# Patient Record
Sex: Female | Born: 1995 | Hispanic: No | Smoking: Former smoker
Health system: Southern US, Community
[De-identification: ages and names within clinical notes are randomized; demographics above are authoritative.]

## PROBLEM LIST (undated history)

## (undated) ENCOUNTER — Inpatient Hospital Stay (HOSPITAL_COMMUNITY): Payer: Self-pay

## (undated) DIAGNOSIS — F32A Depression, unspecified: Secondary | ICD-10-CM

## (undated) DIAGNOSIS — T7840XA Allergy, unspecified, initial encounter: Secondary | ICD-10-CM

## (undated) DIAGNOSIS — F419 Anxiety disorder, unspecified: Secondary | ICD-10-CM

## (undated) DIAGNOSIS — D649 Anemia, unspecified: Secondary | ICD-10-CM

## (undated) DIAGNOSIS — F329 Major depressive disorder, single episode, unspecified: Secondary | ICD-10-CM

## (undated) HISTORY — DX: Major depressive disorder, single episode, unspecified: F32.9

## (undated) HISTORY — DX: Depression, unspecified: F32.A

## (undated) HISTORY — DX: Anemia, unspecified: D64.9

## (undated) HISTORY — DX: Allergy, unspecified, initial encounter: T78.40XA

## (undated) HISTORY — PX: WISDOM TOOTH EXTRACTION: SHX21

## (undated) HISTORY — DX: Anxiety disorder, unspecified: F41.9

---
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
## 2013-01-08 ENCOUNTER — Other Ambulatory Visit: Payer: Self-pay | Admitting: Physician Assistant

## 2013-03-04 ENCOUNTER — Other Ambulatory Visit: Payer: Self-pay | Admitting: Physician Assistant

## 2013-03-04 NOTE — Telephone Encounter (Signed)
Patient's mother wants to send a message to Benny Lennert regarding her daughter's prescription for clonazePAM.  She states that her daughter is in [place] and she will see a therapist on the 27th.  Her daughter has run out of her prescription and is very anxious.  She wants to know if she can have a refill until she sees this therapist.  The desired pharmacy would be [facility] and the number there is [phone]. The mother's cell number is [phone] if you need to call her.

## 2013-03-05 NOTE — Telephone Encounter (Signed)
Signed at TL desk.  

## 2014-03-01 IMAGING — NM NM HEPATO W/GB/PHARM/[PERSON_NAME]
2 series · 12 of 12 positions shown · non-contrast
Comparison: Ultrasound 02/20/2012

CLINICAL DATA: Nausea with vomiting.

NUCLEAR MEDICINE HEPATOBILIARY IMAGING WITH GALLBLADDER EF
TECHNIQUE: Sequential images of the abdomen were obtained [DATE] minutes following intravenous administration of
radiopharmaceutical.  After slow intravenous infusion of
micrograms Cholecystokinin, gallbladder ejection fraction was
determined.
Radiopharmaceutical:  5.0 mCi Wc-TTm Choletec

[Series 0: hepatobiliary · 3.20mm/px · 6 of 31 frames shown (1 of 2)]
[frame 3/31]
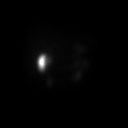
[frame 8/31]
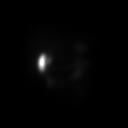
[frame 13/31]
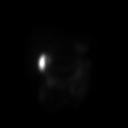
[frame 18/31]
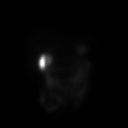
[frame 23/31]
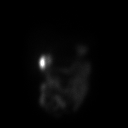
[frame 29/31]
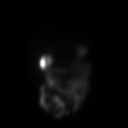

[Series 0: hepatobiliary · 3.20mm/px · 6 of 46 frames shown (2 of 2)]
[frame 4/46]
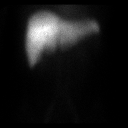
[frame 12/46]
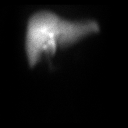
[frame 20/46]
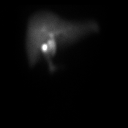
[frame 27/46]
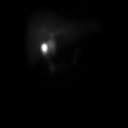
[frame 35/46]
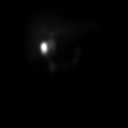
[frame 43/46]
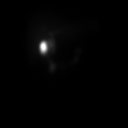

[12 of 12 positions shown; findings below may reference images not displayed]

FINDINGS: Radiopharmaceutical was taken up by the liver and
excreted into the biliary system.  Activity is identified in the
gallbladder and small bowel.  The gallbladder ejection fraction is
62.5%.  Normal ejection fraction is greater than 30%.

The patient did not experience symptoms during CCK infusion.
IMPRESSION: Normal hepatobiliary examination and normal gallbladder ejection
fraction.

## 2014-03-01 IMAGING — US US ABDOMEN COMPLETE
1 series · 14 of 25 positions shown · non-contrast
Comparison: None.

CLINICAL DATA: Nausea and vomiting

ABDOMINAL ULTRASOUND COMPLETE

[Series 1: us abdomen complete · 0.30mm/px · 14 of 60 slices shown]
[im 1/60]
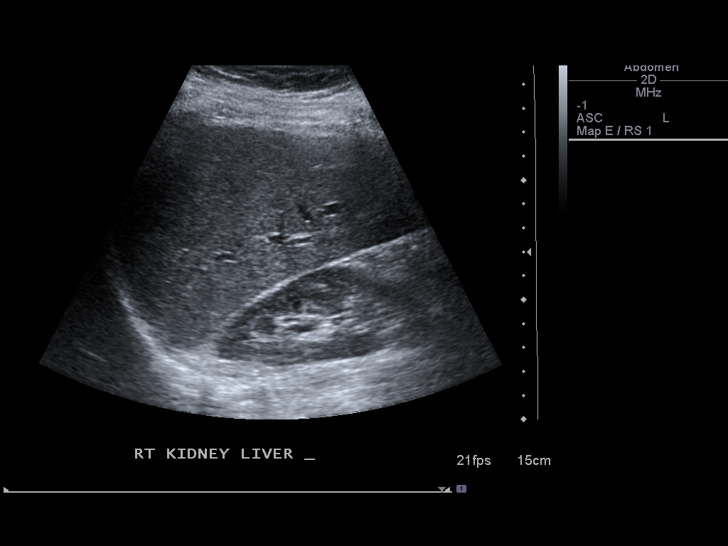
[im 5/60]
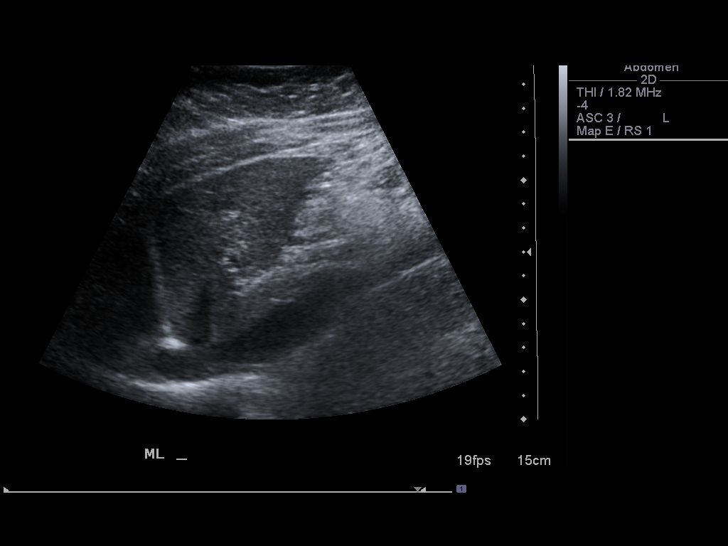
[im 10/60]
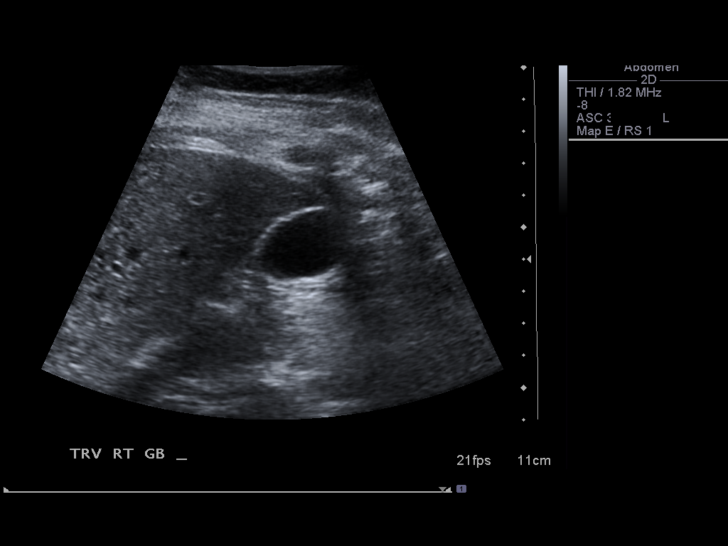
[im 15/60]
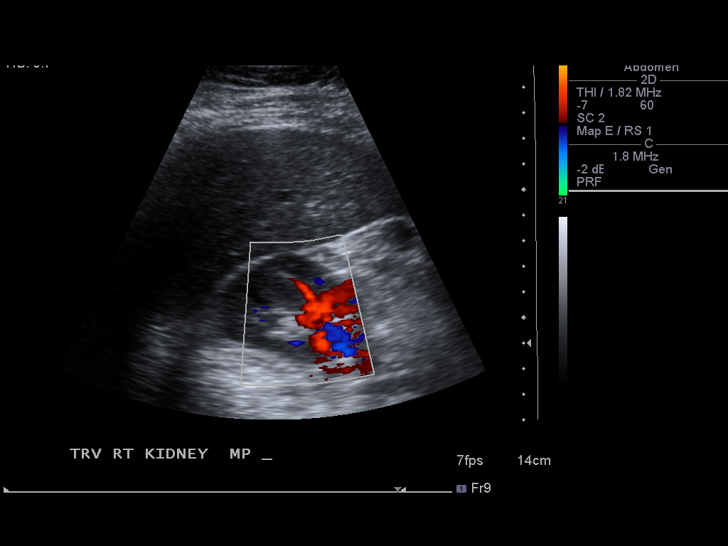
[im 20/60]
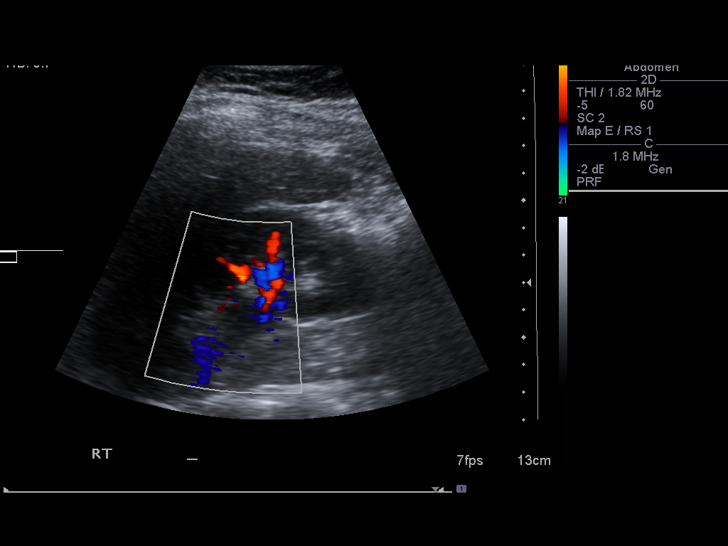
[im 23/60]
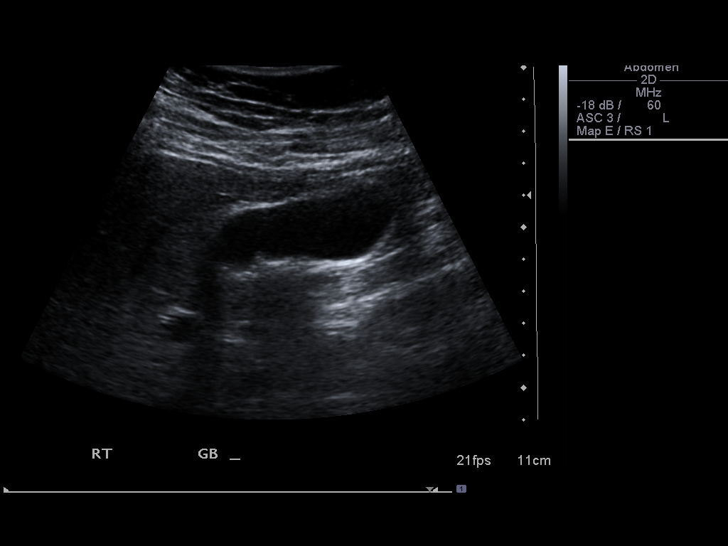
[im 28/60]
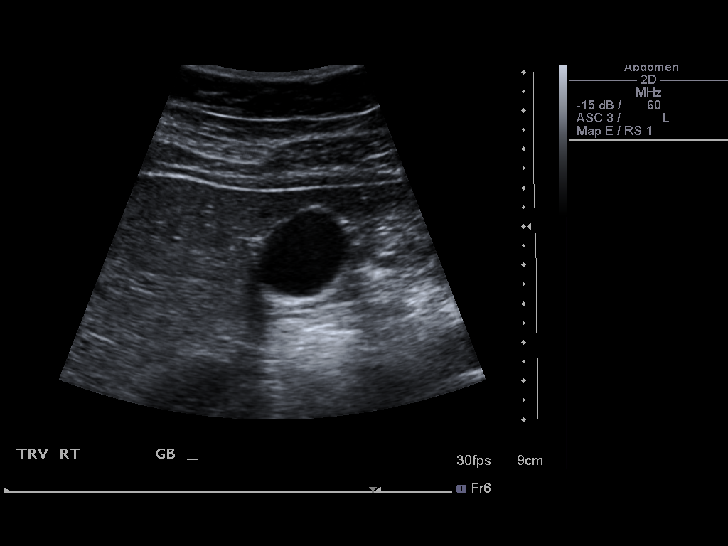
[im 32/60]
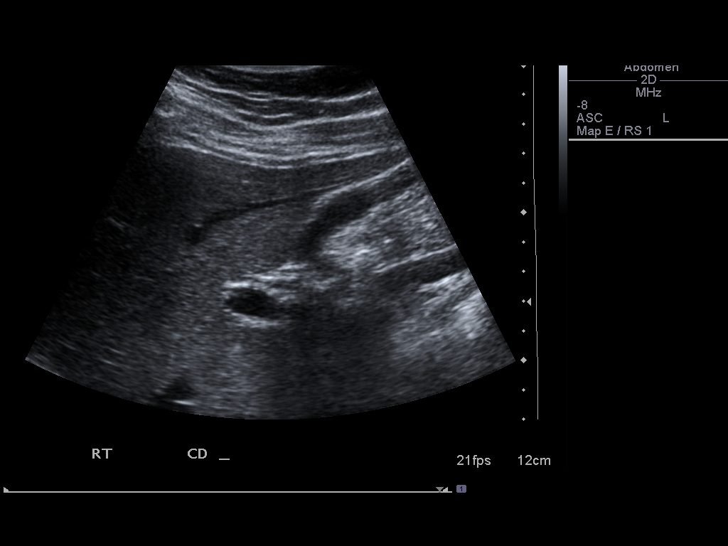
[im 37/60]
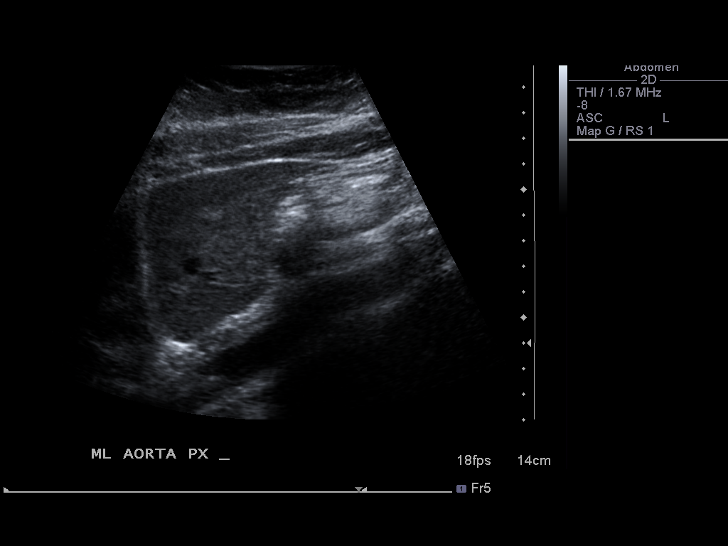
[im 40/60]
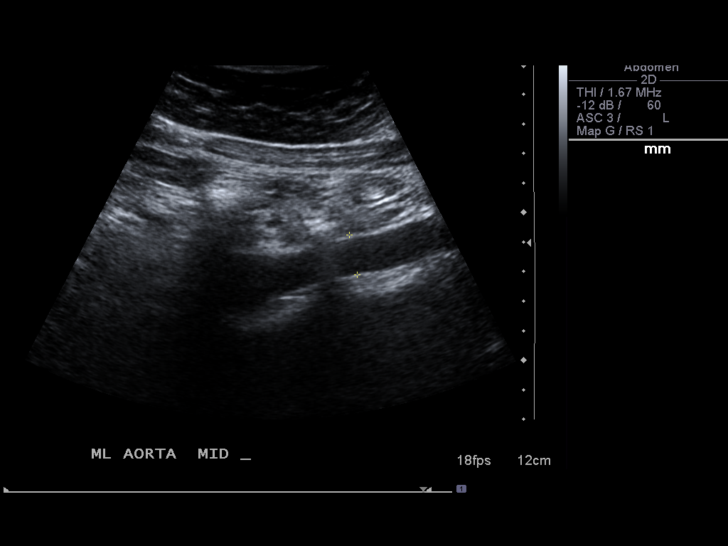
[im 45/60]
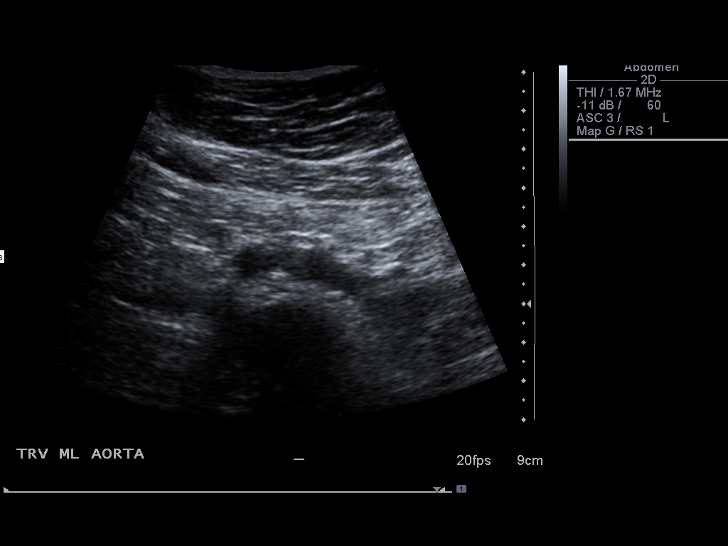
[im 50/60]
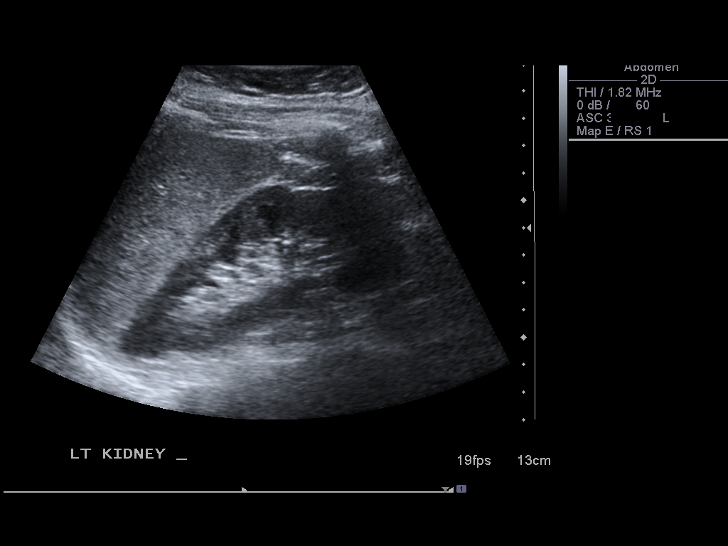
[im 55/60]
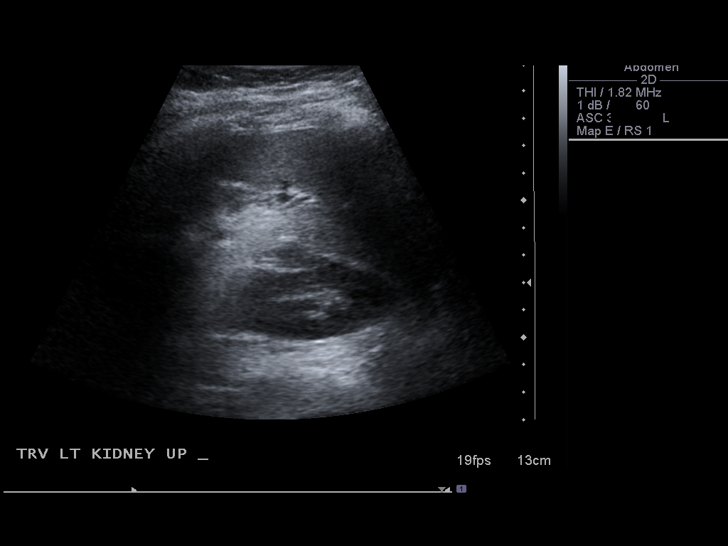
[im 60/60]
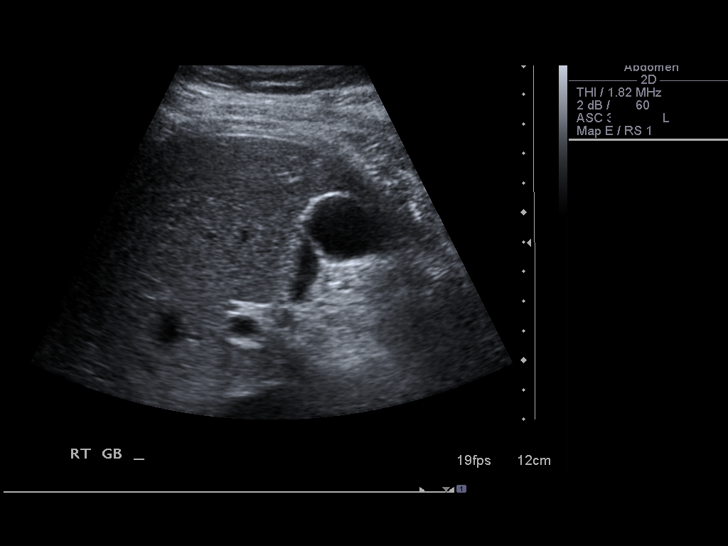

[14 of 25 positions shown; findings below may reference images not displayed]

FINDINGS: Gallbladder:  No gallstones, gallbladder wall thickening, or
pericholecystic fluid.

Common Bile Duct:  Within normal limits in caliber.

Liver: No focal mass lesion identified.  Within normal limits in
parenchymal echogenicity.

IVC:  Appears normal.

Pancreas:  No abnormality identified.

Spleen:  Within normal limits in size and echotexture.

Right kidney:  Normal in size and parenchymal echogenicity.  No
evidence of mass or hydronephrosis.

Left kidney:  Normal in size and parenchymal echogenicity.  No
evidence of mass or hydronephrosis.

Abdominal Aorta:  No aneurysm identified.
IMPRESSION: Negative abdominal ultrasound.

## 2017-11-01 ENCOUNTER — Other Ambulatory Visit: Payer: Self-pay

## 2017-11-01 ENCOUNTER — Ambulatory Visit: Payer: Self-pay | Admitting: Physician Assistant

## 2017-11-01 ENCOUNTER — Encounter: Payer: Self-pay | Admitting: Physician Assistant

## 2017-11-01 VITALS — BP 126/80 | HR 111 | Temp 98.8°F | Resp 16 | Ht 64.5 in | Wt 194.2 lb

## 2017-11-01 DIAGNOSIS — N898 Other specified noninflammatory disorders of vagina: Secondary | ICD-10-CM

## 2017-11-01 DIAGNOSIS — R5383 Other fatigue: Secondary | ICD-10-CM

## 2017-11-01 DIAGNOSIS — Z124 Encounter for screening for malignant neoplasm of cervix: Secondary | ICD-10-CM

## 2017-11-01 LAB — POCT WET + KOH PREP
TRICH BY WET PREP: ABSENT
YEAST BY WET PREP: ABSENT
Yeast by KOH: ABSENT

## 2017-11-01 NOTE — Progress Notes (Signed)
PRIMARY CARE AT [place], [place] [address] [phone]  Date:  11/01/2017   Name:  Amy Melton   DOB:  10/19/1996   MRN:  [id number]  PCP:  Mancel Bale, PA-C    History of Present Illness:  Amy Melton is a 21 y.o. female patient who presents to PCP with  Chief Complaint  Patient presents with  . Gynecologic Exam     --patient reports years of abnormal vaginal discharge and odor.  She feels that this is significant more than normal.  The discharge odor can not be described, but does report an odor that is more prominent by the end of the day.  She works a strenuous labor job, and notes that by the end of the day her underwear are sometimes very wet.  She has never utilized a panty-liner.  She is not sexually active ever.  She does use tampons.   --she has also noted fatigue.  Feels tired though may have full hours of sleep.  She denies any chest pains, sob, or palpitations.  This can occur at any time.  She does have a history of anemia. Periods can be heavy and cramping. No dysuria or frequency  Patient Active Problem List   Diagnosis Date Noted  . Bipolar 2 disorder ([place]) 11/02/2012  . Unspecified deficiency anemia 03/20/2012  . GERD (gastroesophageal reflux disease) 03/20/2012  . Obesity 03/20/2012    Past Medical History:  Diagnosis Date  . Allergy   . Anemia   . Anxiety   . Depression     No past surgical history on file.  Social History   Tobacco Use  . Smoking status: Never Smoker  . Smokeless tobacco: Never Used  Substance Use Topics  . Alcohol use: No  . Drug use: No    No family history on file.  No Known Allergies  Medication list has been reviewed and updated.  Current Outpatient Medications on File Prior to Visit  Medication Sig Dispense Refill  . ABILIFY 10 MG tablet TAKE 1/2 TO 1 TABLET EVERY DAY (Patient not taking: Reported on 11/01/2017) 30 tablet 0  . clonazePAM (KLONOPIN) 0.5 MG tablet TAKE 1 TABLET BY MOUTH TWICE  DAILY AS NEEDED FOR ANXIETY (Patient not taking: Reported on 11/01/2017) 60 tablet 0   No current facility-administered medications on file prior to visit.     ROS ROS otherwise unremarkable unless listed above.  Physical Examination: BP 126/80   Pulse (!) 111   Temp 98.8 F (37.1 C) (Oral)   Resp 16   Ht 5' 4.5" (1.638 m)   Wt 194 lb 3.2 oz (88.1 kg)   LMP 10/07/2017   SpO2 98%   BMI 32.82 kg/m  Ideal Body Weight: Weight in (lb) to have BMI = 25: 147.6  Physical Exam  Constitutional: She is oriented to person, place, and time. She appears well-developed and well-nourished. No distress.  HENT:  Head: Normocephalic and atraumatic.  Right Ear: External ear normal.  Left Ear: External ear normal.  Eyes: Conjunctivae and EOM are normal. Pupils are equal, round, and reactive to light.  Neck: No thyroid mass and no thyromegaly present.  Cardiovascular: Normal rate, regular rhythm and intact distal pulses. Exam reveals no friction rub.  No murmur heard. Pulmonary/Chest: Effort normal. No respiratory distress.  Neurological: She is alert and oriented to person, place, and time.  Skin: She is not diaphoretic.  Psychiatric: She has a normal mood and affect. Her behavior is normal.  Assessment and Plan: [name] is a 21 y.o. female who is here today for cc of  Chief Complaint  Patient presents with  . Gynecologic Exam     Other fatigue - Plan: CBC, CMP14+EGFR, TSH  Vaginal discharge - Plan: POCT Wet + KOH Prep, Pap IG (Image Guided)  Screening for cervical cancer - Plan: Pap IG (Image Guided)  Ivar Drape, PA-C [facility] 11/27/20189:03 AM

## 2017-11-01 NOTE — Patient Instructions (Addendum)
The wet prep appears normal.   I would like you to await contact for pap smear.     IF you received an x-ray today, you will receive an invoice from [facility]. Please contact [facility] at [phone] with questions or concerns regarding your invoice.   IF you received labwork today, you will receive an invoice from [facility]. Please contact [facility] at [phone] with questions or concerns regarding your invoice.   Our billing staff will not be able to assist you with questions regarding bills from these companies.  You will be contacted with the lab results as soon as they are available. The fastest way to get your results is to activate your My Chart account. Instructions are located on the last page of this paperwork. If you have not heard from us regarding the results in 2 weeks, please contact this office.

## 2017-11-02 LAB — CMP14+EGFR
A/G RATIO: 1.9 (ref 1.2–2.2)
ALT: 12 IU/L (ref 0–32)
AST: 16 IU/L (ref 0–40)
Albumin: 4.6 g/dL (ref 3.5–5.5)
Alkaline Phosphatase: 77 IU/L (ref 39–117)
BUN/Creatinine Ratio: 10 (ref 9–23)
BUN: 7 mg/dL (ref 6–20)
CALCIUM: 9.6 mg/dL (ref 8.7–10.2)
CO2: 23 mmol/L (ref 20–29)
Chloride: 104 mmol/L (ref 96–106)
Creatinine, Ser: 0.7 mg/dL (ref 0.57–1.00)
GFR calc non Af Amer: 123 mL/min/{1.73_m2} (ref >59)
GFR, EST AFRICAN AMERICAN: 141 mL/min/{1.73_m2} (ref >59)
GLOBULIN, TOTAL: 2.4 g/dL (ref 1.5–4.5)
Glucose: 120 mg/dL — ABNORMAL HIGH (ref 65–99)
POTASSIUM: 3.9 mmol/L (ref 3.5–5.2)
SODIUM: 141 mmol/L (ref 134–144)
TOTAL PROTEIN: 7 g/dL (ref 6.0–8.5)

## 2017-11-02 LAB — CBC
HEMATOCRIT: 38.7 % (ref 34.0–46.6)
Hemoglobin: 13.3 g/dL (ref 11.1–15.9)
MCH: 31.6 pg (ref 26.6–33.0)
MCHC: 34.4 g/dL (ref 31.5–35.7)
MCV: 92 fL (ref 79–97)
Platelets: 299 10*3/uL (ref 150–379)
RBC: 4.21 x10E6/uL (ref 3.77–5.28)
RDW: 14.3 % (ref 12.3–15.4)
WBC: 15.3 10*3/uL — AB (ref 3.4–10.8)

## 2017-11-02 LAB — TSH: TSH: 1.72 u[IU]/mL (ref 0.450–4.500)

## 2017-11-07 LAB — PAP IG (IMAGE GUIDED): PAP SMEAR COMMENT: 0

## 2017-12-18 ENCOUNTER — Telehealth: Payer: Self-pay | Admitting: Physician Assistant

## 2017-12-18 NOTE — Telephone Encounter (Signed)
Copied from CRM [id number]. Topic: Quick Communication - See Telephone Encounter >> Dec 18, 2017  3:58 PM Amy Melton, Amy Melton wrote: CRM for notification. See Telephone encounter for:  Lab results from Nov.  12/18/17.

## 2017-12-20 NOTE — Telephone Encounter (Signed)
Copied from CRM [phone]. Topic: General - Other >> Dec 20, 2017  4:20 PM Viviann SpareWhite, Amy wrote: Reason for CRM: Patient called again about her labs done in Nov 2018. The reason she is calling is because she has come questions about her labs. Patient would really like a call back asap. She can be reached @ [phone].

## 2017-12-20 NOTE — Telephone Encounter (Signed)
Sent letter in mail today.

## 2017-12-21 NOTE — Telephone Encounter (Signed)
Pt has questions about her labs.  Requests a call back  [place] to [place]

## 2017-12-22 NOTE — Telephone Encounter (Signed)
Copied from CRM [phone]. Topic: Inquiry >> Dec 22, 2017  3:26 PM Raquel SarnaHayes, Teresa G wrote: Pt has called 2 times with no call back.  She has been waiting for a return call to discuss her lab results.  She got her results, but she now has a question to ask and discuss.

## 2017-12-25 NOTE — Telephone Encounter (Signed)
Please contact patient and inquire as to what her question is?

## 2017-12-30 NOTE — Telephone Encounter (Signed)
Please make sure that you continue to try to reach the patient for the question at least twice, on different days

## 2018-03-21 ENCOUNTER — Encounter: Payer: Self-pay | Admitting: Physician Assistant

## 2018-08-22 ENCOUNTER — Encounter: Payer: Self-pay | Admitting: Physician Assistant

## 2018-08-22 ENCOUNTER — Other Ambulatory Visit: Payer: Self-pay

## 2018-08-22 ENCOUNTER — Ambulatory Visit: Payer: Managed Care, Other (non HMO) | Admitting: Physician Assistant

## 2018-08-22 VITALS — BP 117/80 | HR 93 | Temp 98.7°F | Resp 18 | Ht 64.5 in | Wt 196.0 lb

## 2018-08-22 DIAGNOSIS — F4010 Social phobia, unspecified: Secondary | ICD-10-CM

## 2018-08-22 DIAGNOSIS — Z23 Encounter for immunization: Secondary | ICD-10-CM | POA: Diagnosis not present

## 2018-08-22 DIAGNOSIS — G479 Sleep disorder, unspecified: Secondary | ICD-10-CM | POA: Diagnosis not present

## 2018-08-22 DIAGNOSIS — F331 Major depressive disorder, recurrent, moderate: Secondary | ICD-10-CM | POA: Diagnosis not present

## 2018-08-22 MED ORDER — DESVENLAFAXINE SUCCINATE ER 25 MG PO TB24: 25.0000 mg | ORAL_TABLET | Freq: Every day | ORAL | 0 refills | Status: DC

## 2018-08-22 MED ORDER — CLONAZEPAM 0.5 MG PO TABS: 0.2500 mg | ORAL_TABLET | Freq: Every day | ORAL | 0 refills | Status: DC

## 2018-08-22 MED ORDER — TRAZODONE HCL 50 MG PO TABS: 25.0000 mg | ORAL_TABLET | Freq: Every evening | ORAL | 0 refills | Status: DC | PRN

## 2018-08-22 NOTE — Patient Instructions (Addendum)
[facility] - [address], [place], [place] [address] Phone: [phone]   To start the Trazodone - start with 1/2 pill at night for the 1st 4 nights - increase the dose by 25mg (1/2 pill) every 4-5 nights until either you sleep well, have side effects or reach a nightly dose of 150mg   If you have lab work done today you will be contacted with your lab results within the next 2 weeks.  If you have not heard from us then please contact us. The fastest way to get your results is to register for My Chart.   IF you received an x-ray today, you will receive an invoice from [facility]. Please contact [facility] at [phone] with questions or concerns regarding your invoice.   IF you received labwork today, you will receive an invoice from [facility]. Please contact [facility] at [phone] with questions or concerns regarding your invoice.   Our billing staff will not be able to assist you with questions regarding bills from these companies.  You will be contacted with the lab results as soon as they are available. The fastest way to get your results is to activate your My Chart account. Instructions are located on the last page of this paperwork. If you have not heard from us regarding the results in 2 weeks, please contact this office.     

## 2018-08-22 NOTE — Progress Notes (Signed)
Amy Melton  MRN: [id number] DOB: 08-23-1996  PCP: Julieanne Manson, MD  Chief Complaint  Patient presents with  . Anxiety  . Depression    Subjective:  Pt presents to clinic for pression and anxiety and wanting to go back on medications.  Back from [place] - planning on going back to school -- anxiety is really bad - difficult to go to work - [employer] -   She has not been on therapy and she stopped all her meds - she felt like she was doing really good but lately (2-3 months) and things have started to return  Social anxiety is bad, depression, cannot turn off mind - she over thinks everything, she cannot sleep but always tired and has no energy.  She has been on abilify, lamictal, wellbutrin --  Graduated HS - but did not take SAT  Calling out of work - to tired - people irritate her   History is obtained by patient.  Review of Systems  Psychiatric/Behavioral: Positive for dysphoric mood and sleep disturbance (Does not sleep well). The patient is nervous/anxious.     Patient Active Problem List   Diagnosis Date Noted  . Bipolar 2 disorder (HCC) 11/02/2012  . Unspecified deficiency anemia 03/20/2012  . GERD (gastroesophageal reflux disease) 03/20/2012  . Obesity 03/20/2012    No current outpatient medications on file prior to visit.   No current facility-administered medications on file prior to visit.     No Known Allergies  Past Medical History:  Diagnosis Date  . Allergy   . Anemia   . Anxiety   . Depression    Social History   Social History Narrative  . Not on file   Social History   Tobacco Use  . Smoking status: Former Games developer  . Smokeless tobacco: Never Used  Substance Use Topics  . Alcohol use: Yes    Alcohol/week: 2.0 standard drinks    Types: 2 Standard drinks or equivalent per week  . Drug use: No   family history is not on file.     Objective:  BP 117/80   Pulse 93   Temp 98.7 F (37.1 C) (Oral)   Resp 18   Ht 5' 4.5"  (1.638 m)   Wt 196 lb (88.9 kg)   LMP 08/14/2018   SpO2 98%   BMI 33.12 kg/m  Body mass index is 33.12 kg/m.  Wt Readings from Last 3 Encounters:  08/22/18 196 lb (88.9 kg)  11/01/17 194 lb 3.2 oz (88.1 kg)  11/02/12 220 lb (99.8 kg) (99 %, Z= 2.20)*   * Growth percentiles are based on CDC (Girls, 2-20 Years) data.    Physical Exam  Constitutional: She is oriented to person, place, and time.  HENT:  Head: Normocephalic and atraumatic.  Right Ear: Hearing and external ear normal.  Left Ear: Hearing and external ear normal.  Eyes: Conjunctivae are normal.  Neck: Normal range of motion.  Cardiovascular: Normal rate, regular rhythm and normal heart sounds.  No murmur heard. Pulmonary/Chest: Effort normal and breath sounds normal. She has no wheezes.  Neurological: She is alert and oriented to person, place, and time.  Skin: Skin is warm and dry.  Psychiatric: Judgment normal.  Vitals reviewed.   Assessment and Plan :  Social anxiety disorder - Plan: Desvenlafaxine Succinate ER (PRISTIQ) 25 MG TB24, clonazePAM (KLONOPIN) 0.5 MG tablet, DISCONTINUED: clonazePAM (KLONOPIN) 0.5 MG tablet  Moderate episode of recurrent major depressive disorder (HCC) - Plan: Desvenlafaxine Succinate ER (PRISTIQ)  25 MG TB24  Sleep disturbance - Plan: traZODone (DESYREL) 50 MG tablet  Flu vaccine need - Plan: Flu Vaccine QUAD 36+ mos IM   Restart Klonopin to help control anxiety in the short-term.  Start titration of Pristiq with 25 mg for 2 weeks then increase to 50 if well tolerated.  Start trazodone titration for sleep, instructions given to patient.  Follow-up with me in 4 to 6 weeks.  Patient verbalized to me that they understand the following: diagnosis, what is being done for them, what to expect and what should be done at home.  Their questions have been answered.  See after visit summary for patient specific instructions.  Benny Lennert PA-C  [facility] 08/28/2018 10:16 PM  Please note: Portions of this report may have been transcribed using dragon voice recognition software. Every effort was made to ensure accuracy; however, inadvertent computerized transcription errors may be present.

## 2018-08-23 ENCOUNTER — Telehealth: Payer: Self-pay | Admitting: Physician Assistant

## 2018-08-23 MED ORDER — CLONAZEPAM 0.5 MG PO TABS: 0.2500 mg | ORAL_TABLET | Freq: Every day | ORAL | 0 refills | Status: DC

## 2018-08-23 NOTE — Telephone Encounter (Signed)
Copied from CRM [id number]. Topic: General - Other >> Aug 23, 2018 11:58 AM Percival SpanishKennedy, Cheryl W wrote:  Pt said pharmacy is waiting ton the following RX clonazePAM (KLONOPIN) 0.5 MG tablet   [facility] [address]

## 2018-08-23 NOTE — Telephone Encounter (Signed)
Pt called to check status of Rx; it appears the pt did not receive her printed copy of the medication; contact to advise

## 2018-08-23 NOTE — Telephone Encounter (Signed)
Please see note below. 

## 2018-08-24 ENCOUNTER — Encounter: Payer: Self-pay | Admitting: *Deleted

## 2018-08-24 NOTE — Telephone Encounter (Signed)
Patient already picked up prescription.

## 2018-09-11 ENCOUNTER — Ambulatory Visit: Payer: Self-pay | Admitting: Internal Medicine

## 2018-09-13 ENCOUNTER — Other Ambulatory Visit: Payer: Self-pay | Admitting: Physician Assistant

## 2018-09-13 DIAGNOSIS — F4010 Social phobia, unspecified: Secondary | ICD-10-CM

## 2018-09-13 DIAGNOSIS — F331 Major depressive disorder, recurrent, moderate: Secondary | ICD-10-CM

## 2018-09-14 NOTE — Telephone Encounter (Signed)
Please advise 

## 2018-09-15 NOTE — Telephone Encounter (Signed)
Previous PCP Amy Melton Last visit in Aug Seems plans were to follow up with her at new employment meds refilled for 30 days no refills

## 2019-10-13 ENCOUNTER — Other Ambulatory Visit: Payer: Self-pay

## 2019-10-13 ENCOUNTER — Ambulatory Visit (HOSPITAL_COMMUNITY)
Admission: EM | Admit: 2019-10-13 | Discharge: 2019-10-13 | Disposition: A | Discharge status: Home / Self Care | Payer: Managed Care, Other (non HMO) | Attending: Physician Assistant | Admitting: Physician Assistant

## 2019-10-13 ENCOUNTER — Encounter (HOSPITAL_COMMUNITY): Payer: Self-pay

## 2019-10-13 DIAGNOSIS — S161XXA Strain of muscle, fascia and tendon at neck level, initial encounter: Secondary | ICD-10-CM

## 2019-10-13 DIAGNOSIS — J0101 Acute recurrent maxillary sinusitis: Secondary | ICD-10-CM

## 2019-10-13 MED ORDER — AMOXICILLIN-POT CLAVULANATE 875-125 MG PO TABS: 1.0000 | ORAL_TABLET | Freq: Two times a day (BID) | ORAL | 0 refills | Status: DC

## 2019-10-13 NOTE — ED Provider Notes (Signed)
[facility]    CSN: [id number] Arrival date & time: 10/13/19  1408      History   Chief Complaint Chief Complaint  Patient presents with  . Neck Pain  . Migraine  . Facial Pain    HPI Amy Melton is a 23 y.o. female.   Patient here concerned with "sinus infection" x 2 weeks.  Notes when she gets sinus infections neck and heard start hurting.  Admits nasal congestion, rhinorrhea, maxillary sinus pain, denies fever, chills, otalgia, nausea, vomiting, diarrhea, abdominal pain, wheezing, coughing, SOB.  Taking ibuprofen, last dose 7 hours ago with mild relief of sx.  She saw ENT 6 months ago who just treated her for sinus infection.       Past Medical History:  Diagnosis Date  . Allergy   . Anemia   . Anxiety   . Depression     Patient Active Problem List   Diagnosis Date Noted  . Bipolar 2 disorder (HCC) 11/02/2012  . Unspecified deficiency anemia 03/20/2012  . GERD (gastroesophageal reflux disease) 03/20/2012  . Obesity 03/20/2012    History reviewed. No pertinent surgical history.  OB History   No obstetric history on file.      Home Medications    Prior to Admission medications   Medication Sig Start Date End Date Taking? Authorizing Provider  amoxicillin-clavulanate (AUGMENTIN) 875-125 MG tablet Take 1 tablet by mouth every 12 (twelve) hours. 10/13/19   Evern CoreLindquist, Selby Slovacek, PA-C  clonazePAM (KLONOPIN) 0.5 MG tablet Take 0.5-1 tablets (0.25-0.5 mg total) by mouth daily. for anxiety 08/23/18   Valarie ConesWeber, Dema SeverinSarah L, PA-C  Desvenlafaxine Succinate ER 25 MG TB24 TAKE 1 TABLET EVERY DAY 09/15/18   Myles LippsSantiago, Irma M, MD  traZODone (DESYREL) 50 MG tablet Take 0.5-3 tablets (25-150 mg total) by mouth at bedtime as needed for sleep. 08/22/18   Valarie ConesWeber, Dema SeverinSarah L, PA-C    Family History History reviewed. No pertinent family history.  Social History Social History   Tobacco Use  . Smoking status: Former Games developermoker  . Smokeless tobacco: Never Used  Substance Use Topics   . Alcohol use: Yes    Alcohol/week: 2.0 standard drinks    Types: 2 Standard drinks or equivalent per week  . Drug use: No     Allergies   Patient has no known allergies.   Review of Systems Review of Systems  Constitutional: Negative for chills and fatigue.  HENT: Positive for congestion, postnasal drip, rhinorrhea, sinus pressure and sinus pain. Negative for ear discharge, ear pain, facial swelling, sneezing and sore throat.   Eyes: Negative for discharge and redness.  Respiratory: Negative for cough, chest tightness and shortness of breath.   Gastrointestinal: Negative for abdominal pain, diarrhea, nausea and vomiting.  Musculoskeletal: Positive for neck pain and neck stiffness. Negative for back pain and myalgias.  Skin: Negative for color change and pallor.  Neurological: Positive for headaches. Negative for weakness, light-headedness and numbness.  Hematological: Negative for adenopathy. Does not bruise/bleed easily.  Psychiatric/Behavioral: Positive for sleep disturbance. Negative for confusion.     Physical Exam Triage Vital Signs ED Triage Vitals  Enc Vitals Group     BP 10/13/19 1425 125/89     Pulse Rate 10/13/19 1425 94     Resp 10/13/19 1425 16     Temp 10/13/19 1425 98.8 F (37.1 C)     Temp Source 10/13/19 1425 Oral     SpO2 10/13/19 1425 99 %     Weight --  Height --      Head Circumference --      Peak Flow --      Pain Score 10/13/19 1423 10     Pain Loc --      Pain Edu? --      Excl. in GC? --    No data found.  Updated Vital Signs BP 125/89 (BP Location: Left Arm)   Pulse 94   Temp 98.8 F (37.1 C) (Oral)   Resp 16   LMP 10/11/2019   SpO2 99%   Visual Acuity Right Eye Distance:   Left Eye Distance:   Bilateral Distance:    Right Eye Near:   Left Eye Near:    Bilateral Near:     Physical Exam Vitals signs and nursing note reviewed.  Constitutional:      General: She is not in acute distress.    Appearance: Normal  appearance. She is well-developed. She is obese. She is not ill-appearing or toxic-appearing.  HENT:     Head: Normocephalic and atraumatic.     Right Ear: Tympanic membrane and ear canal normal. No swelling or tenderness. No middle ear effusion. Tympanic membrane is not injected, scarred, perforated, retracted or bulging.     Left Ear: Tympanic membrane and ear canal normal. No swelling or tenderness.  No middle ear effusion. Tympanic membrane is not injected, scarred, perforated, retracted or bulging.     Nose: Mucosal edema, congestion and rhinorrhea present. Rhinorrhea is clear.     Right Turbinates: Swollen. Not enlarged.     Left Turbinates: Swollen. Not enlarged.     Right Sinus: Maxillary sinus tenderness and frontal sinus tenderness present.     Left Sinus: Maxillary sinus tenderness and frontal sinus tenderness present.     Mouth/Throat:     Mouth: Mucous membranes are moist.     Pharynx: Oropharynx is clear. No pharyngeal swelling, oropharyngeal exudate, posterior oropharyngeal erythema or uvula swelling.     Tonsils: No tonsillar exudate or tonsillar abscesses. 0 on the right. 0 on the left.  Eyes:     Conjunctiva/sclera: Conjunctivae normal.  Neck:     Musculoskeletal: Neck supple. Normal range of motion. Pain with movement and muscular tenderness present. No edema, erythema, neck rigidity or spinous process tenderness.     Meningeal: Brudzinski's sign and Kernig's sign absent.  Cardiovascular:     Rate and Rhythm: Normal rate and regular rhythm.     Heart sounds: No murmur.  Pulmonary:     Effort: Pulmonary effort is normal. No respiratory distress.     Breath sounds: Normal breath sounds. No wheezing, rhonchi or rales.  Musculoskeletal: Normal range of motion.  Lymphadenopathy:     Cervical: No cervical adenopathy.  Skin:    General: Skin is warm and dry.     Capillary Refill: Capillary refill takes less than 2 seconds.  Neurological:     General: No focal deficit  present.     Mental Status: She is alert and oriented to person, place, and time.  Psychiatric:        Mood and Affect: Mood normal.        Behavior: Behavior normal.      UC Treatments / Results  Labs (all labs ordered are listed, but only abnormal results are displayed) Labs Reviewed - No data to display  EKG   Radiology No results found.  Procedures Procedures (including critical care time)  Medications Ordered in UC Medications - No data to display  Initial  Impression / Assessment and Plan / UC Course  I have reviewed the triage vital signs and the nursing notes.  Pertinent labs & imaging results that were available during my care of the patient were reviewed by me and considered in my medical decision making (see chart for details).      Final Clinical Impressions(s) / UC Diagnoses   Final diagnoses:  Acute recurrent maxillary sinusitis  Acute strain of neck muscle, initial encounter     Discharge Instructions     Follow up with ENT and PCP Take medication as prescribed. Take ibuprofen or tylenol every 6 to 8 hours as needed for pain. Ice neck 15 minutes 4 times per day.    ED Prescriptions    Medication Sig Dispense Auth. Provider   amoxicillin-clavulanate (AUGMENTIN) 875-125 MG tablet Take 1 tablet by mouth every 12 (twelve) hours. 14 tablet Peri Jefferson, PA-C     PDMP not reviewed this encounter.   Peri Jefferson, PA-C 10/13/19 1639

## 2019-10-13 NOTE — ED Triage Notes (Signed)
Pt present neck, headache and sinus pain. Symptoms been going on for over  Two weeks. Pt states that her neck has stiff and she is unable to move it  As much.

## 2019-10-13 NOTE — Discharge Instructions (Addendum)
Follow up with ENT and PCP Take medication as prescribed. Take ibuprofen or tylenol every 6 to 8 hours as needed for pain. Ice neck 15 minutes 4 times per day.

## 2020-05-21 ENCOUNTER — Encounter (HOSPITAL_COMMUNITY): Payer: Self-pay

## 2020-05-21 ENCOUNTER — Ambulatory Visit (HOSPITAL_COMMUNITY)
Admission: EM | Admit: 2020-05-21 | Discharge: 2020-05-21 | Disposition: A | Discharge status: Home / Self Care | Payer: BC Managed Care – PPO | Attending: Family Medicine | Admitting: Family Medicine

## 2020-05-21 DIAGNOSIS — R1030 Lower abdominal pain, unspecified: Secondary | ICD-10-CM | POA: Insufficient documentation

## 2020-05-21 DIAGNOSIS — Z3202 Encounter for pregnancy test, result negative: Secondary | ICD-10-CM

## 2020-05-21 DIAGNOSIS — N898 Other specified noninflammatory disorders of vagina: Secondary | ICD-10-CM | POA: Diagnosis present

## 2020-05-21 LAB — POCT URINALYSIS DIP (DEVICE)
Bilirubin Urine: NEGATIVE
Glucose, UA: NEGATIVE mg/dL
Hgb urine dipstick: NEGATIVE
Ketones, ur: NEGATIVE mg/dL
Nitrite: NEGATIVE
Protein, ur: NEGATIVE mg/dL
Specific Gravity, Urine: 1.025 (ref 1.005–1.030)
Urobilinogen, UA: 0.2 mg/dL (ref 0.0–1.0)
pH: 7.5 (ref 5.0–8.0)

## 2020-05-21 LAB — POC URINE PREG, ED: Preg Test, Ur: NEGATIVE

## 2020-05-21 MED ORDER — LIDOCAINE HCL (PF) 1 % IJ SOLN
INTRAMUSCULAR | Status: AC
  Filled 2020-05-21: qty 2

## 2020-05-21 MED ORDER — CEFTRIAXONE SODIUM 500 MG IJ SOLR
INTRAMUSCULAR | Status: AC
  Filled 2020-05-21: qty 500

## 2020-05-21 MED ORDER — AZITHROMYCIN 250 MG PO TABS
1000.0000 mg | ORAL_TABLET | Freq: Once | ORAL | Status: AC
  Administered 2020-05-21: 1000 mg via ORAL

## 2020-05-21 MED ORDER — METRONIDAZOLE 500 MG PO TABS
500.0000 mg | ORAL_TABLET | Freq: Two times a day (BID) | ORAL | 0 refills | Status: DC
Start: 2020-05-21 — End: 2023-09-29

## 2020-05-21 MED ORDER — FLUCONAZOLE 150 MG PO TABS
150.0000 mg | ORAL_TABLET | Freq: Every day | ORAL | 0 refills | Status: DC
Start: 2020-05-21 — End: 2023-09-29

## 2020-05-21 MED ORDER — AZITHROMYCIN 250 MG PO TABS
ORAL_TABLET | ORAL | Status: AC
  Filled 2020-05-21: qty 4

## 2020-05-21 MED ORDER — CEFTRIAXONE SODIUM 500 MG IJ SOLR
500.0000 mg | Freq: Once | INTRAMUSCULAR | Status: AC
  Administered 2020-05-21: 500 mg via INTRAMUSCULAR

## 2020-05-21 NOTE — Discharge Instructions (Addendum)
Treating you for gonorrhea, chlamydia, trichomonas, yeast and bacterial vaginosis today. Medicine given here for gonorrhea and chlamydia.  Sending a prescription to the pharmacy to treat for bacterial vaginosis, yeast and trichomonas Urine did not show infection and you are not pregnant.

## 2020-05-21 NOTE — ED Triage Notes (Signed)
Pt c/o vaginal itching and pain. Pt c/o bumps around lips of vagina and [id number]. PT has been taking monistat since Sunday, but it's not helping. Pt requests a female provider.

## 2020-05-22 LAB — CERVICOVAGINAL ANCILLARY ONLY
Bacterial Vaginitis (gardnerella): NEGATIVE
Candida Glabrata: NEGATIVE
Candida Vaginitis: NEGATIVE
Chlamydia: NEGATIVE
Comment: NEGATIVE
Comment: NEGATIVE
Comment: NEGATIVE
Comment: NEGATIVE
Comment: NEGATIVE
Comment: NORMAL
Neisseria Gonorrhea: NEGATIVE
Trichomonas: NEGATIVE

## 2020-05-22 LAB — URINE CULTURE: Culture: NO GROWTH

## 2020-05-23 NOTE — ED Provider Notes (Signed)
[facility]    CSN: [id number] Arrival date & time: 05/21/20  1142      History   Chief Complaint Chief Complaint  Patient presents with  . vaginal irritation    HPI Amy Melton is a 24 y.o. female.   Patient is a 24 year old female presents today with vaginal itching, irritation pain. Reporting some bumps in the vagina area this started after the vaginal irritation. Has been using Monistat since Sunday that made the situation worse. Denies any specific vaginal discharge. She has mild lower pelvic discomfort and low-grade fever. Overall reports she just does not feel well. Currently sexual active with one partner, unprotected. Uses for control. Has had some mild spotting.   ROS per HPI      Past Medical History:  Diagnosis Date  . Allergy   . Anemia   . Anxiety   . Depression     Patient Active Problem List   Diagnosis Date Noted  . Bipolar 2 disorder (HCC) 11/02/2012  . Unspecified deficiency anemia 03/20/2012  . GERD (gastroesophageal reflux disease) 03/20/2012  . Obesity 03/20/2012    History reviewed. No pertinent surgical history.  OB History   No obstetric history on file.      Home Medications    Prior to Admission medications   Medication Sig Start Date End Date Taking? Authorizing Provider  buPROPion (WELLBUTRIN XL) 150 MG 24 hr tablet Take 10 mg by mouth. 07/12/19  Yes [provider]  clonazePAM (KLONOPIN) 0.5 MG tablet Take 0.5-1 tablets (0.25-0.5 mg total) by mouth daily. for anxiety 08/23/18   Valarie Cones, Dema Severin, PA-C  fluconazole (DIFLUCAN) 150 MG tablet Take 1 tablet (150 mg total) by mouth daily. 05/21/20   Dahlia Byes A, NP  metroNIDAZOLE (FLAGYL) 500 MG tablet Take 1 tablet (500 mg total) by mouth 2 (two) times daily. 05/21/20   Dahlia Byes A, NP  traZODone (DESYREL) 50 MG tablet Take 0.5-3 tablets (25-150 mg total) by mouth at bedtime as needed for sleep. 08/22/18   Valarie Cones, Dema Severin, PA-C  Desvenlafaxine Succinate ER 25 MG TB24  TAKE 1 TABLET EVERY DAY Patient taking differently: 150 mg.  09/15/18 05/21/20  Myles Lipps, MD    Family History Family History  Problem Relation Age of Onset  . Cancer Mother     Social History Social History   Tobacco Use  . Smoking status: Former Games developer  . Smokeless tobacco: Never Used  Substance Use Topics  . Alcohol use: Yes    Alcohol/week: 7.0 standard drinks    Types: 1 Glasses of wine, 5 Cans of beer, 1 Shots of liquor per week  . Drug use: No     Allergies   Patient has no known allergies.   Review of Systems Review of Systems   Physical Exam Triage Vital Signs ED Triage Vitals  Enc Vitals Group     BP 05/21/20 1232 130/84     Pulse Rate 05/21/20 1232 92     Resp 05/21/20 1232 16     Temp 05/21/20 1232 99.5 F (37.5 C)     Temp Source 05/21/20 1232 Oral     SpO2 05/21/20 1232 97 %     Weight 05/21/20 1233 160 lb (72.6 kg)     Height 05/21/20 1233 5\' 5"  (1.651 m)     Head Circumference --      Peak Flow --      Pain Score 05/21/20 1232 8     Pain Loc --  Pain Edu? --      Excl. in GC? --    No data found.  Updated Vital Signs BP 130/84   Pulse 92   Temp 99.5 F (37.5 C) (Oral)   Resp 16   Ht 5\' 5"  (1.651 m)   Wt 160 lb (72.6 kg)   SpO2 97%   BMI 26.63 kg/m   Visual Acuity Right Eye Distance:   Left Eye Distance:   Bilateral Distance:    Right Eye Near:   Left Eye Near:    Bilateral Near:     Physical Exam Vitals and nursing note reviewed.  Constitutional:      General: She is not in acute distress.    Appearance: Normal appearance. She is not ill-appearing, toxic-appearing or diaphoretic.  HENT:     Head: Normocephalic.     Nose: Nose normal.  Eyes:     Conjunctiva/sclera: Conjunctivae normal.  Pulmonary:     Effort: Pulmonary effort is normal.  Genitourinary:    Comments: Swelling irration and erythema to the external vaginal area. multiple inflamed hair follicles and open sores. No vesicles. Thick white  discharge at vaginal opening.   Internal vaginal exam with thick, mucous green/clear discharge fo cervix.  Cervical friability No CMT  Musculoskeletal:        General: Normal range of motion.     Cervical back: Normal range of motion.  Skin:    General: Skin is warm and dry.     Findings: No rash.  Neurological:     Mental Status: She is alert.  Psychiatric:        Mood and Affect: Mood normal.      UC Treatments / Results  Labs (all labs ordered are listed, but only abnormal results are displayed) Labs Reviewed  POCT URINALYSIS DIP (DEVICE) - Abnormal; Notable for the following components:      Result Value   Leukocytes,Ua TRACE (*)    All other components within normal limits  URINE CULTURE  POC URINE PREG, ED  CERVICOVAGINAL ANCILLARY ONLY    EKG   Radiology No results found.  Procedures Procedures (including critical care time)  Medications Ordered in UC Medications  cefTRIAXone (ROCEPHIN) injection 500 mg (500 mg Intramuscular Given 05/21/20 1323)  azithromycin (ZITHROMAX) tablet 1,000 mg (1,000 mg Oral Given 05/21/20 1323)    Initial Impression / Assessment and Plan / UC Course  I have reviewed the triage vital signs and the nursing notes.  Pertinent labs & imaging results that were available during my care of the patient were reviewed by me and considered in my medical decision making (see chart for details).     Lower abdominal pain and vaginal discharge. Patient also with low-grade fever and overall not feeling well today. Based on pelvic exam there is some concern for STD or bacterial infection Rash to vaginal area appears to be more related to irritation possibly from shaving or the Monistat We will go ahead and cover for STDs today, BV and yeast Urine without infection or pregnancy Follow up as needed for continued or worsening symptoms  Final Clinical Impressions(s) / UC Diagnoses   Final diagnoses:  Vaginal discharge  Lower abdominal pain      Discharge Instructions     Treating you for gonorrhea, chlamydia, trichomonas, yeast and bacterial vaginosis today. Medicine given here for gonorrhea and chlamydia.  Sending a prescription to the pharmacy to treat for bacterial vaginosis, yeast and trichomonas Urine did not show infection and you are  not pregnant.     ED Prescriptions    Medication Sig Dispense Auth. Provider   metroNIDAZOLE (FLAGYL) 500 MG tablet Take 1 tablet (500 mg total) by mouth 2 (two) times daily. 14 tablet Latash Nouri A, NP   fluconazole (DIFLUCAN) 150 MG tablet Take 1 tablet (150 mg total) by mouth daily. 2 tablet Loura Halt A, NP     PDMP not reviewed this encounter.   Orvan July, NP 05/23/20 1347

## 2020-08-31 ENCOUNTER — Other Ambulatory Visit: Payer: Self-pay | Admitting: Critical Care Medicine

## 2020-08-31 ENCOUNTER — Other Ambulatory Visit: Payer: BC Managed Care – PPO

## 2020-08-31 DIAGNOSIS — Z20822 Contact with and (suspected) exposure to covid-19: Secondary | ICD-10-CM

## 2020-09-02 LAB — SPECIMEN STATUS REPORT

## 2020-09-02 LAB — SARS-COV-2, NAA 2 DAY TAT

## 2020-09-02 LAB — NOVEL CORONAVIRUS, NAA: SARS-CoV-2, NAA: NOT DETECTED

## 2023-03-21 LAB — OB RESULTS CONSOLE GC/CHLAMYDIA
Chlamydia: NEGATIVE
Neisseria Gonorrhea: NEGATIVE

## 2023-03-21 LAB — HEPATITIS C ANTIBODY: HCV Ab: NEGATIVE

## 2023-03-21 LAB — OB RESULTS CONSOLE HIV ANTIBODY (ROUTINE TESTING): HIV: NONREACTIVE

## 2023-03-21 LAB — OB RESULTS CONSOLE HEPATITIS B SURFACE ANTIGEN: Hepatitis B Surface Ag: NEGATIVE

## 2023-03-21 LAB — OB RESULTS CONSOLE RPR: RPR: NONREACTIVE

## 2023-03-21 LAB — OB RESULTS CONSOLE RUBELLA ANTIBODY, IGM: Rubella: IMMUNE

## 2023-05-22 ENCOUNTER — Emergency Department (HOSPITAL_COMMUNITY)
Admission: EM | Admit: 2023-05-22 | Discharge: 2023-05-23 | Disposition: A | Discharge status: Home / Self Care | Payer: BC Managed Care – PPO | Attending: Emergency Medicine | Admitting: Emergency Medicine

## 2023-05-22 ENCOUNTER — Encounter (HOSPITAL_COMMUNITY): Payer: Self-pay

## 2023-05-22 ENCOUNTER — Encounter: Payer: Self-pay | Admitting: Hematology and Oncology

## 2023-05-22 DIAGNOSIS — M545 Low back pain, unspecified: Secondary | ICD-10-CM | POA: Insufficient documentation

## 2023-05-22 DIAGNOSIS — Z3A18 18 weeks gestation of pregnancy: Secondary | ICD-10-CM | POA: Diagnosis not present

## 2023-05-22 DIAGNOSIS — O26892 Other specified pregnancy related conditions, second trimester: Secondary | ICD-10-CM | POA: Diagnosis not present

## 2023-05-22 LAB — COMPREHENSIVE METABOLIC PANEL
ALT: 20 U/L (ref 0–44)
AST: 19 U/L (ref 15–41)
Albumin: 3.3 g/dL — ABNORMAL LOW (ref 3.5–5.0)
Alkaline Phosphatase: 59 U/L (ref 38–126)
Anion gap: 9 (ref 5–15)
BUN: 8 mg/dL (ref 6–20)
CO2: 22 mmol/L (ref 22–32)
Calcium: 9.1 mg/dL (ref 8.9–10.3)
Chloride: 105 mmol/L (ref 98–111)
Creatinine, Ser: 0.64 mg/dL (ref 0.44–1.00)
GFR, Estimated: >60 mL/min (ref >60)
Glucose, Bld: 104 mg/dL — ABNORMAL HIGH (ref 70–99)
Potassium: 3.4 mmol/L — ABNORMAL LOW (ref 3.5–5.1)
Sodium: 136 mmol/L (ref 135–145)
Total Bilirubin: 0.3 mg/dL (ref 0.3–1.2)
Total Protein: 7.2 g/dL (ref 6.5–8.1)

## 2023-05-22 LAB — URINALYSIS, W/ REFLEX TO CULTURE (INFECTION SUSPECTED)
Bacteria, UA: NONE SEEN
Bilirubin Urine: NEGATIVE
Glucose, UA: NEGATIVE mg/dL
Hgb urine dipstick: NEGATIVE
Ketones, ur: NEGATIVE mg/dL
Nitrite: NEGATIVE
Protein, ur: NEGATIVE mg/dL
Specific Gravity, Urine: 1.014 (ref 1.005–1.030)
pH: 6 (ref 5.0–8.0)

## 2023-05-22 LAB — CBC
HCT: 39.1 % (ref 36.0–46.0)
Hemoglobin: 13 g/dL (ref 12.0–15.0)
MCH: 31 pg (ref 26.0–34.0)
MCHC: 33.2 g/dL (ref 30.0–36.0)
MCV: 93.3 fL (ref 80.0–100.0)
Platelets: 297 10*3/uL (ref 150–400)
RBC: 4.19 MIL/uL (ref 3.87–5.11)
RDW: 12.9 % (ref 11.5–15.5)
WBC: 13.8 10*3/uL — ABNORMAL HIGH (ref 4.0–10.5)
nRBC: 0 % (ref 0.0–0.2)

## 2023-05-22 LAB — HCG, QUANTITATIVE, PREGNANCY: hCG, Beta Chain, Quant, S: 13786 m[IU]/mL — ABNORMAL HIGH (ref <5)

## 2023-05-22 MED ORDER — LIDOCAINE 5 % EX PTCH
1.0000 | MEDICATED_PATCH | CUTANEOUS | Status: DC
  Administered 2023-05-23: 1 via TRANSDERMAL
  Filled 2023-05-22: qty 1

## 2023-05-22 MED ORDER — LACTATED RINGERS IV BOLUS
1000.0000 mL | Freq: Once | INTRAVENOUS | Status: AC
  Administered 2023-05-22: 1000 mL via INTRAVENOUS

## 2023-05-22 MED ORDER — MORPHINE SULFATE (PF) 2 MG/ML IV SOLN
2.0000 mg | Freq: Once | INTRAVENOUS | Status: AC
  Administered 2023-05-23: 2 mg via INTRAVENOUS
  Filled 2023-05-22: qty 1

## 2023-05-22 MED ORDER — ONDANSETRON HCL 4 MG/2ML IJ SOLN
4.0000 mg | Freq: Once | INTRAMUSCULAR | Status: AC
  Administered 2023-05-22: 4 mg via INTRAVENOUS
  Filled 2023-05-22: qty 2

## 2023-05-22 MED ORDER — POTASSIUM CHLORIDE CRYS ER 20 MEQ PO TBCR
40.0000 meq | EXTENDED_RELEASE_TABLET | Freq: Once | ORAL | Status: AC
  Administered 2023-05-22: 40 meq via ORAL
  Filled 2023-05-22: qty 2

## 2023-05-22 MED ORDER — MORPHINE SULFATE (PF) 2 MG/ML IV SOLN
2.0000 mg | Freq: Once | INTRAVENOUS | Status: AC
  Administered 2023-05-22: 2 mg via INTRAVENOUS
  Filled 2023-05-22: qty 1

## 2023-05-22 NOTE — ED Triage Notes (Signed)
Patient states she has lower back pain that radiates to her upper back that began on saturday. Patient states she is [redacted] weeks pregnant with an EDD of 10/21/2023. Denies bleeding or discharge, or concerns with urination. Patient was given cyclobenzaprine by her OBGYN, with no relief. Last dose was taken at 530pm.

## 2023-05-22 NOTE — ED Provider Notes (Signed)
Amy Melton EMERGENCY DEPARTMENT AT [facility] Provider Note   CSN: [id number] Arrival date & time: 05/22/23  1819     History {Add pertinent medical, surgical, social history, OB history to HPI:1} Chief Complaint  Patient presents with   Back Pain    Amy Melton is a 27 y.o. female.   Back Pain  Patient is a 27 year old female with no past surgical history, history of anemia anxiety depression allergies  She is present emergency room today with complaints of low back pain and some right-sided abdominal pain send that her symptoms began on Saturday and have been constant since.  She is [redacted] weeks pregnant has had regular OB monitoring.  She has had normal ultrasounds during her pregnancy so far.  She states that she has not had any urinary symptoms apart from frequency that was associated with her pregnancy.  She denies any burning with urination or fevers.  She states that she has had some issues w low back pain and occasional uses muscle relaxers.   She states her pain is worse than it has been previously.  She states it also feels quite different from previous episodes of back pain.      Home Medications Prior to Admission medications   Medication Sig Start Date End Date Taking? Authorizing Provider  buPROPion (WELLBUTRIN XL) 150 MG 24 hr tablet Take 10 mg by mouth. 07/12/19   [provider]  clonazePAM (KLONOPIN) 0.5 MG tablet Take 0.5-1 tablets (0.25-0.5 mg total) by mouth daily. for anxiety 08/23/18   Valarie Cones, Dema Severin, PA-C  fluconazole (DIFLUCAN) 150 MG tablet Take 1 tablet (150 mg total) by mouth daily. 05/21/20   Dahlia Byes A, NP  metroNIDAZOLE (FLAGYL) 500 MG tablet Take 1 tablet (500 mg total) by mouth 2 (two) times daily. 05/21/20   Dahlia Byes A, NP  traZODone (DESYREL) 50 MG tablet Take 0.5-3 tablets (25-150 mg total) by mouth at bedtime as needed for sleep. 08/22/18   Valarie Cones, Dema Severin, PA-C  Desvenlafaxine Succinate ER 25 MG TB24 TAKE 1 TABLET EVERY  DAY Patient taking differently: 150 mg.  09/15/18 05/21/20  Lezlie Lye, Meda Coffee, MD      Allergies    Patient has no known allergies.    Review of Systems   Review of Systems  Musculoskeletal:  Positive for back pain.    Physical Exam Updated Vital Signs BP 122/81   Pulse 79   Temp 98.9 F (37.2 C) (Oral)   Resp 14   Ht 5\' 5"  (1.651 m)   Wt 98.4 kg   SpO2 98%   BMI 36.11 kg/m  Physical Exam Vitals and nursing note reviewed.  Constitutional:      General: She is not in acute distress. HENT:     Head: Normocephalic and atraumatic.     Nose: Nose normal.  Eyes:     General: No scleral icterus. Cardiovascular:     Rate and Rhythm: Normal rate and regular rhythm.     Pulses: Normal pulses.     Heart sounds: Normal heart sounds.  Pulmonary:     Effort: Pulmonary effort is normal. No respiratory distress.     Breath sounds: No wheezing.  Abdominal:     Palpations: Abdomen is soft.     Tenderness: There is no abdominal tenderness.  Musculoskeletal:     Cervical back: Normal range of motion.     Right lower leg: No edema.     Left lower leg: No edema.  Skin:  General: Skin is warm and dry.     Capillary Refill: Capillary refill takes less than 2 seconds.  Neurological:     Mental Status: She is alert. Mental status is at baseline.  Psychiatric:        Mood and Affect: Mood normal.        Behavior: Behavior normal.     ED Results / Procedures / Treatments   Labs (all labs ordered are listed, but only abnormal results are displayed) Labs Reviewed  COMPREHENSIVE METABOLIC PANEL - Abnormal; Notable for the following components:      Result Value   Potassium 3.4 (*)    Glucose, Bld 104 (*)    Albumin 3.3 (*)    All other components within normal limits  CBC - Abnormal; Notable for the following components:   WBC 13.8 (*)    All other components within normal limits  URINALYSIS, W/ REFLEX TO CULTURE (INFECTION SUSPECTED) - Abnormal; Notable for the following  components:   APPearance HAZY (*)    Leukocytes,Ua LARGE (*)    All other components within normal limits  HCG, QUANTITATIVE, PREGNANCY - Abnormal; Notable for the following components:   hCG, Beta Chain, Quant, S 13,786 (*)    All other components within normal limits    EKG None  Radiology No results found.  Procedures Procedures  {Document cardiac monitor, telemetry assessment procedure when appropriate:1}  Medications Ordered in ED Medications  potassium chloride SA (KLOR-CON M) CR tablet 40 mEq (40 mEq Oral Given 05/22/23 2117)  lactated ringers bolus 1,000 mL (1,000 mLs Intravenous New Bag/Given 05/22/23 2117)  morphine (PF) 2 MG/ML injection 2 mg (2 mg Intravenous Given 05/22/23 2119)  ondansetron (ZOFRAN) injection 4 mg (4 mg Intravenous Given 05/22/23 2118)    ED Course/ Medical Decision Making/ A&P   {   Click here for ABCD2, HEART and other calculatorsREFRESH Note before signing :1}                          Medical Decision Making Amount and/or Complexity of Data Reviewed Labs: ordered.  Risk Prescription drug management.   ***  {Document critical care time when appropriate:1} {Document review of labs and clinical decision tools ie heart score, Chads2Vasc2 etc:1}  {Document your independent review of radiology images, and any outside records:1} {Document your discussion with family members, caretakers, and with consultants:1} {Document social determinants of health affecting pt's care:1} {Document your decision making why or why not admission, treatments were needed:1} Final Clinical Impression(s) / ED Diagnoses Final diagnoses:  None    Rx / DC Orders ED Discharge Orders     None

## 2023-05-22 NOTE — ED Provider Notes (Signed)
Physical Exam  BP 132/79   Pulse 75   Temp 98.9 F (37.2 C)   Resp 15   Ht 5\' 5"  (1.651 m)   Wt 98.4 kg   SpO2 96%   BMI 36.11 kg/m   Physical Exam Vitals and nursing note reviewed.  Constitutional:      General: She is not in acute distress.    Appearance: She is well-developed.  HENT:     Head: Normocephalic and atraumatic.  Eyes:     Conjunctiva/sclera: Conjunctivae normal.  Cardiovascular:     Rate and Rhythm: Normal rate and regular rhythm.     Heart sounds: No murmur heard. Pulmonary:     Effort: Pulmonary effort is normal. No respiratory distress.     Breath sounds: Normal breath sounds.  Abdominal:     Palpations: Abdomen is soft.     Tenderness: There is no abdominal tenderness.  Musculoskeletal:        General: No swelling.     Cervical back: Neck supple.     Comments: No midline tenderness of cervical, thoracic, lumbar spine along the step-off or deformity noted.  Paraspinal tenderness noted in the right lumbar region  Skin:    General: Skin is warm and dry.     Capillary Refill: Capillary refill takes less than 2 seconds.  Neurological:     Mental Status: She is alert.  Psychiatric:        Mood and Affect: Mood normal.     Procedures  Procedures  ED Course / MDM    Medical Decision Making Amount and/or Complexity of Data Reviewed Labs: ordered.  Risk Prescription drug management.    27 year old female presents emergency department with complaints of right-sided low back pain.  Patient states that symptoms began this past Saturday.  States that symptoms began today after she was lifting a bunch of flooring at her work.  Describes pain as cramping type nature.  Earlier reported some radiation to her right lower abdomen.  Patient is currently [redacted] weeks pregnant with regular OB monitoring outpatient settings.  Unified women's health and C.  Denies any fever, nausea, vomiting, urinary symptoms, vaginal symptoms.  Denies any weakness or sensory  deficits in lower extremities, saddle anesthesia, bowel/bladder dysfunction, history of IV drug use.  Was seen by OB/GYN via telehealth and was prescribed a muscle relaxer yesterday which she states has not been helping.  Has been given dose of morphine while in the ED with continued pain.  Plan is to reassess and reach out to OB/GYN on-call.  Laboratory studies: Mild hypokalemia 3.4 but otherwise, electrolytes within normal limits.  No transaminitis.  No renal dysfunction.  Leukocytosis of 13.8.  No evidence anemia.  Platelets within range.  UA significant for large leukocytes but otherwise unremarkable.  Beta-hCG elevated of 13,000, 786.  Consults: Consulted attending physician Dr. Jeraldine Loots who independently evaluated the patient and was agreement with treatment plan going forward.  Right low back pain Vital signs within normal range and stable throughout visit Laboratory studies significant for: See above 27 year old female presents emergency department with complaints of right low back pain after lifting flooring on Saturday.  Pain was somewhat refractory to Tylenol as well as muscle laxer prescribed by OB/GYN in the outpatient setting prompting visit to the emergency department today.  Patient without any abdominal tenderness on my exam.  Patient without red flag signs for back pain so low suspicion for acute spinal cord compression.  UA without signs of infection or gross hematuria  so low suspicion for pyelonephritis/nephrolithiasis.  Patient's symptoms seem more muscular in nature.  Will recommend continued use of Tylenol in the outpatient setting, topical Lidoderm patch as well as muscle relaxer with close follow-up with OB/GYN.  Strict return precautions were discussed at length.  Treatment plan discussed at length with patient and she acknowledged understanding was agreeable to said plan.  Patient overall well-appearing, afebrile in no acute distress. Worrisome signs and symptoms were discussed  with the patient the patient knowledge understanding to return to emergency department if notice.    Peter Garter, [place] 05/23/23 1610    Gerhard Munch, MD 05/25/23 2212

## 2023-05-22 NOTE — ED Provider Notes (Incomplete)
Physical Exam  BP 122/81   Pulse 79   Temp 98.9 F (37.2 C) (Oral)   Resp 14   Ht 5\' 5"  (1.651 m)   Wt 98.4 kg   SpO2 98%   BMI 36.11 kg/m   Physical Exam Vitals and nursing note reviewed.  Constitutional:      General: She is not in acute distress.    Appearance: She is well-developed.  HENT:     Head: Normocephalic and atraumatic.  Eyes:     Conjunctiva/sclera: Conjunctivae normal.  Cardiovascular:     Rate and Rhythm: Normal rate and regular rhythm.     Heart sounds: No murmur heard. Pulmonary:     Effort: Pulmonary effort is normal. No respiratory distress.     Breath sounds: Normal breath sounds.  Abdominal:     Palpations: Abdomen is soft.     Tenderness: There is no abdominal tenderness.  Musculoskeletal:        General: No swelling.     Cervical back: Neck supple.     Comments: No midline tenderness of cervical, thoracic, lumbar spine along the step-off or deformity noted.  Paraspinal tenderness noted in the right lumbar region  Skin:    General: Skin is warm and dry.     Capillary Refill: Capillary refill takes less than 2 seconds.  Neurological:     Mental Status: She is alert.  Psychiatric:        Mood and Affect: Mood normal.     Procedures  Procedures  ED Course / MDM    Medical Decision Making Amount and/or Complexity of Data Reviewed Labs: ordered.  Risk Prescription drug management.    27 year old female presents emergency department with complaints of right-sided low back pain.  Patient states that symptoms began this past Saturday.  States that symptoms began today after she was lifting a bunch of flooring at her work.  Describes pain as cramping type nature.  Earlier reported some radiation to her right lower abdomen.  Patient is currently [redacted] weeks pregnant with regular OB monitoring outpatient settings.  Unified women's health and C.  Denies any fever, nausea, vomiting, urinary symptoms, vaginal symptoms.  Denies any weakness or sensory  deficits in lower extremities, saddle anesthesia, bowel/bladder dysfunction, history of IV drug use.  Was seen by OB/GYN via telehealth and was prescribed a muscle relaxer yesterday which she states has not been helping.  Has been given dose of morphine while in the ED with continued pain.  Plan is to reassess and reach out to OB/GYN on-call.  Laboratory studies: Mild hypokalemia 3.4 but otherwise, electrolytes within normal limits.  No transaminitis.  No renal dysfunction.  Leukocytosis of 13.8.  No evidence anemia.  Platelets within range.  UA significant for large leukocytes but otherwise unremarkable.  Beta-hCG elevated of 13,000, 786.  Right low back pain Vital signs within normal range and stable throughout visit Laboratory studies significant for: See above 27 year old female presents emergency department with complaints of right low back pain after lifting flooring on Saturday.  Pain was somewhat refractory to Tylenol as well as muscle laxer prescribed by OB/GYN in the outpatient setting prompting visit to the emergency department today.  Patient without any abdominal tenderness on my exam.  Patient without red flag signs for back pain so low suspicion for acute spinal cord compression.  UA without signs of infection or gross hematuria so low suspicion for pyelonephritis/nephrolithiasis.  Patient's symptoms seem more muscular in nature.  Will recommend continued use of  Tylenol in the outpatient setting, topical Lidoderm patch as well as muscle relaxer with close follow-up with OB/GYN.  Strict return precautions were discussed at length.  Treatment plan discussed at length with patient and she acknowledged understanding was agreeable to said plan.  Patient overall well-appearing, afebrile in no acute distress. Worrisome signs and symptoms were discussed with the patient the patient knowledge understanding to return to emergency department if notice.

## 2023-05-23 MED ORDER — LIDOCAINE 5 % EX PTCH: 1.0000 | MEDICATED_PATCH | CUTANEOUS | 0 refills | Status: DC

## 2023-05-23 NOTE — ED Notes (Signed)
Pt has received a [place] sandwich and a coke.

## 2023-05-23 NOTE — Discharge Instructions (Addendum)
As discussed, we will send in topical numbing patches called Lidoderm patches to use over affected area.  Recommend stretching exercises as well as continue use of muscle relaxer/Tylenol as needed.  Recommend follow-up with OB/GYN within the next 1 to 2 days for reevaluation of your symptoms.  Please not hesitate to return to emergency department for worrisome signs and symptoms we discussed become apparent.

## 2023-09-29 ENCOUNTER — Inpatient Hospital Stay (EMERGENCY_DEPARTMENT_HOSPITAL)
Admission: AD | Admit: 2023-09-29 | Discharge: 2023-09-29 | Disposition: A | Discharge status: Home / Self Care | Payer: BC Managed Care – PPO | Source: Home / Self Care | Attending: Obstetrics and Gynecology | Admitting: Obstetrics and Gynecology

## 2023-09-29 ENCOUNTER — Encounter (HOSPITAL_COMMUNITY): Payer: Self-pay | Admitting: Obstetrics and Gynecology

## 2023-09-29 DIAGNOSIS — O133 Gestational [pregnancy-induced] hypertension without significant proteinuria, third trimester: Secondary | ICD-10-CM | POA: Insufficient documentation

## 2023-09-29 DIAGNOSIS — O321XX0 Maternal care for breech presentation, not applicable or unspecified: Secondary | ICD-10-CM | POA: Insufficient documentation

## 2023-09-29 DIAGNOSIS — O26893 Other specified pregnancy related conditions, third trimester: Secondary | ICD-10-CM | POA: Insufficient documentation

## 2023-09-29 DIAGNOSIS — Z3A36 36 weeks gestation of pregnancy: Secondary | ICD-10-CM | POA: Insufficient documentation

## 2023-09-29 DIAGNOSIS — O4292 Full-term premature rupture of membranes, unspecified as to length of time between rupture and onset of labor: Secondary | ICD-10-CM | POA: Diagnosis not present

## 2023-09-29 LAB — TYPE AND SCREEN
ABO/RH(D): O POS
Antibody Screen: NEGATIVE

## 2023-09-29 LAB — COMPREHENSIVE METABOLIC PANEL
ALT: 12 U/L (ref 0–44)
AST: 17 U/L (ref 15–41)
Albumin: 2.6 g/dL — ABNORMAL LOW (ref 3.5–5.0)
Alkaline Phosphatase: 108 U/L (ref 38–126)
Anion gap: 12 (ref 5–15)
BUN: <5 mg/dL — ABNORMAL LOW (ref 6–20)
CO2: 19 mmol/L — ABNORMAL LOW (ref 22–32)
Calcium: 9.1 mg/dL (ref 8.9–10.3)
Chloride: 103 mmol/L (ref 98–111)
Creatinine, Ser: 0.59 mg/dL (ref 0.44–1.00)
GFR, Estimated: >60 mL/min (ref >60)
Glucose, Bld: 90 mg/dL (ref 70–99)
Potassium: 3.7 mmol/L (ref 3.5–5.1)
Sodium: 134 mmol/L — ABNORMAL LOW (ref 135–145)
Total Bilirubin: 0.4 mg/dL (ref 0.3–1.2)
Total Protein: 6.5 g/dL (ref 6.5–8.1)

## 2023-09-29 LAB — CBC
HCT: 36.6 % (ref 36.0–46.0)
Hemoglobin: 11.9 g/dL — ABNORMAL LOW (ref 12.0–15.0)
MCH: 28.1 pg (ref 26.0–34.0)
MCHC: 32.5 g/dL (ref 30.0–36.0)
MCV: 86.3 fL (ref 80.0–100.0)
Platelets: 255 10*3/uL (ref 150–400)
RBC: 4.24 MIL/uL (ref 3.87–5.11)
RDW: 14.4 % (ref 11.5–15.5)
WBC: 12.2 10*3/uL — ABNORMAL HIGH (ref 4.0–10.5)
nRBC: 0 % (ref 0.0–0.2)

## 2023-09-29 LAB — RPR: RPR Ser Ql: NONREACTIVE

## 2023-09-29 LAB — PROTEIN / CREATININE RATIO, URINE
Creatinine, Urine: 39 mg/dL
Protein Creatinine Ratio: 0.23 mg/mg{creat} — ABNORMAL HIGH (ref 0.00–0.15)
Total Protein, Urine: 9 mg/dL

## 2023-09-29 NOTE — MAU Note (Signed)
RN called lab to inquire about CBC and Comprehensive Metabolic Panel. Lab tech, Delice Bison, answered and informed RN that that they have received sample and will process now.Marland Kitchen

## 2023-09-29 NOTE — MAU Provider Note (Signed)
History     CSN: [id number]  Arrival date and time: 09/29/23 1001   Event Date/Time   First Provider Initiated Contact with Patient 09/29/23 1043      Chief Complaint  Patient presents with   Hypertension   HPI Amy Melton is a 27 y.o. G1P0 at [redacted]w[redacted]d who presents for hypertension. Denies history of hypertension. Was in office today for ROB & BP was 160s/110s. Sent here for labs. Per Dr. Jackelyn Knife, breech presentation by ultrasound today.  Reports occasional headaches this week. Currently denies headache. Denies visual disturbance or epigastric pain. Denies contractions, LOF, or vaginal bleeding. Reports good fetal movement.   OB History     Gravida  1   Para      Term      Preterm      AB      Living         SAB      IAB      Ectopic      Multiple      Live Births              Past Medical History:  Diagnosis Date   Allergy    Anemia    Anxiety     Past Surgical History:  Procedure Laterality Date   WISDOM TOOTH EXTRACTION      Family History  Problem Relation Age of Onset   Cancer Mother     Social History   Tobacco Use   Smoking status: Former   Smokeless tobacco: Never  Substance Use Topics   Alcohol use: Not Currently    Alcohol/week: 7.0 standard drinks of alcohol    Types: 1 Glasses of wine, 5 Cans of beer, 1 Shots of liquor per week   Drug use: No    Allergies: No Known Allergies  No medications prior to admission.    Review of Systems  All other systems reviewed and are negative.  Physical Exam   Blood pressure (!) 143/82, pulse 99, temperature 98.8 F (37.1 C), temperature source Oral, resp. rate 14, SpO2 99%.  Physical Exam Vitals and nursing note reviewed.  Constitutional:      General: She is not in acute distress.    Appearance: Normal appearance.  HENT:     Head: Normocephalic and atraumatic.  Eyes:     General: No scleral icterus.    Conjunctiva/sclera: Conjunctivae normal.  Pulmonary:     Effort:  Pulmonary effort is normal. No respiratory distress.  Musculoskeletal:     Right lower leg: 2+ Pitting Edema present.     Left lower leg: 2+ Pitting Edema present.  Skin:    General: Skin is warm and dry.  Neurological:     Mental Status: She is alert.     Deep Tendon Reflexes:     Reflex Scores:      Patellar reflexes are 2+ on the right side and 2+ on the left side.    Comments: No clonus  Psychiatric:        Mood and Affect: Mood normal.        Behavior: Behavior normal.     NST:  Baseline: 145 bpm, Variability: Good {> 6 bpm), Accelerations: Reactive, and Decelerations: Absent   MAU Course  Procedures Results for orders placed or performed during the hospital encounter of 09/29/23 (from the past 24 hour(s))  Protein / creatinine ratio, urine     Status: Abnormal   Collection Time: 09/29/23 10:30 AM  Result Value Ref Range  Creatinine, Urine 39 mg/dL   Total Protein, Urine 9 mg/dL   Protein Creatinine Ratio 0.23 (H) 0.00 - 0.15 mg/mg[Cre]  Type and screen     Status: None   Collection Time: 09/29/23 10:54 AM  Result Value Ref Range   ABO/RH(D) O POS    Antibody Screen NEG    Sample Expiration      10/02/2023,2359 Performed at [facility], [address] [place], [place] [address]   CBC     Status: Abnormal   Collection Time: 09/29/23 10:59 AM  Result Value Ref Range   WBC 12.2 (H) 4.0 - 10.5 K/uL   RBC 4.24 3.87 - 5.11 MIL/uL   Hemoglobin 11.9 (L) 12.0 - 15.0 g/dL   HCT 13.0 86.5 - 78.4 %   MCV 86.3 80.0 - 100.0 fL   MCH 28.1 26.0 - 34.0 pg   MCHC 32.5 30.0 - 36.0 g/dL   RDW 69.6 29.5 - 28.4 %   Platelets 255 150 - 400 K/uL   nRBC 0.0 0.0 - 0.2 %  Comprehensive metabolic panel     Status: Abnormal   Collection Time: 09/29/23 10:59 AM  Result Value Ref Range   Sodium 134 (L) 135 - 145 mmol/L   Potassium 3.7 3.5 - 5.1 mmol/L   Chloride 103 98 - 111 mmol/L   CO2 19 (L) 22 - 32 mmol/L   Glucose, Bld 90 70 - 99 mg/dL   BUN <5 (L) 6 - 20 mg/dL    Creatinine, Ser 1.32 0.44 - 1.00 mg/dL   Calcium 9.1 8.9 - 44.0 mg/dL   Total Protein 6.5 6.5 - 8.1 g/dL   Albumin 2.6 (L) 3.5 - 5.0 g/dL   AST 17 15 - 41 U/L   ALT 12 0 - 44 U/L   Alkaline Phosphatase 108 38 - 126 U/L   Total Bilirubin 0.4 0.3 - 1.2 mg/dL   GFR, Estimated >10 >27 mL/min   Anion gap 12 5 - 15  RPR     Status: None   Collection Time: 09/29/23 10:59 AM  Result Value Ref Range   RPR Ser Ql NON REACTIVE NON REACTIVE    MDM GHTN No SRBPs, asymptomatic, normal labs  Assessment and Plan   1. Gestational hypertension, third trimester   2. Breech presentation, single or unspecified fetus   3. [redacted] weeks gestation of pregnancy    Dr. Timothy Lasso aware of patient. Will bring back tomorrow for ECV & IOL vs C/s. Reviewed preeclampsia precautions.   Judeth Horn 09/29/2023, 2:43 PM

## 2023-09-29 NOTE — MAU Note (Signed)
..  Amy Melton is a 27 y.o. at [redacted]w[redacted]d here in MAU reporting: sent over from office for severe range BP today in the office. Has had a slight headache recently, but unsure if its related to the weather. Also an increase in swelling and 8lb weight gain since her ROB appt last week. Denies visual disturbances or epigastric pain. No VB or LOF. +FM. Baby determined to be in breech presentation in the office, they discussed a version if admitted and patient is interested. Has sve in the office and is reporting mild ctx,  was 1cm.   NPO: last night at 2000- pizza rolls           Water at 0830  Pain score: head-2   ctx- 2 Vitals:   09/29/23 1028  BP: (!) 150/90  Pulse: (!) 109  Resp: 14  Temp: 98.8 F (37.1 C)     FHT:150 Lab orders placed from triage:  UA

## 2023-09-30 ENCOUNTER — Inpatient Hospital Stay (HOSPITAL_COMMUNITY): Payer: BC Managed Care – PPO | Attending: Obstetrics and Gynecology

## 2023-09-30 ENCOUNTER — Inpatient Hospital Stay (HOSPITAL_COMMUNITY)
Admission: RE | Admit: 2023-09-30 | Payer: BC Managed Care – PPO | Source: Home / Self Care | Admitting: Obstetrics and Gynecology

## 2023-09-30 ENCOUNTER — Other Ambulatory Visit: Payer: Self-pay

## 2023-09-30 ENCOUNTER — Inpatient Hospital Stay (HOSPITAL_COMMUNITY): Payer: BC Managed Care – PPO | Admitting: Anesthesiology

## 2023-09-30 ENCOUNTER — Encounter (HOSPITAL_COMMUNITY)
Admission: AD | Disposition: A | Discharge status: Home / Self Care | Payer: Self-pay | Source: Home / Self Care | Attending: Obstetrics and Gynecology

## 2023-09-30 ENCOUNTER — Inpatient Hospital Stay (HOSPITAL_COMMUNITY): Payer: BC Managed Care – PPO

## 2023-09-30 ENCOUNTER — Encounter: Payer: Self-pay | Admitting: Hematology and Oncology

## 2023-09-30 ENCOUNTER — Inpatient Hospital Stay (HOSPITAL_COMMUNITY)
Admission: AD | Admit: 2023-09-30 | Discharge: 2023-10-02 | DRG: 787 | Disposition: A | Discharge status: Home / Self Care | Payer: BC Managed Care – PPO | Attending: Obstetrics and Gynecology | Admitting: Obstetrics and Gynecology

## 2023-09-30 ENCOUNTER — Encounter (HOSPITAL_COMMUNITY): Payer: Self-pay | Admitting: Obstetrics and Gynecology

## 2023-09-30 DIAGNOSIS — F32A Depression, unspecified: Secondary | ICD-10-CM | POA: Diagnosis present

## 2023-09-30 DIAGNOSIS — R200 Anesthesia of skin: Secondary | ICD-10-CM | POA: Diagnosis present

## 2023-09-30 DIAGNOSIS — Z87891 Personal history of nicotine dependence: Secondary | ICD-10-CM | POA: Diagnosis not present

## 2023-09-30 DIAGNOSIS — O4292 Full-term premature rupture of membranes, unspecified as to length of time between rupture and onset of labor: Secondary | ICD-10-CM | POA: Diagnosis present

## 2023-09-30 DIAGNOSIS — O9962 Diseases of the digestive system complicating childbirth: Secondary | ICD-10-CM | POA: Diagnosis present

## 2023-09-30 DIAGNOSIS — O99344 Other mental disorders complicating childbirth: Secondary | ICD-10-CM | POA: Diagnosis present

## 2023-09-30 DIAGNOSIS — O134 Gestational [pregnancy-induced] hypertension without significant proteinuria, complicating childbirth: Secondary | ICD-10-CM | POA: Diagnosis present

## 2023-09-30 DIAGNOSIS — Z3A37 37 weeks gestation of pregnancy: Secondary | ICD-10-CM | POA: Diagnosis not present

## 2023-09-30 DIAGNOSIS — A6000 Herpesviral infection of urogenital system, unspecified: Secondary | ICD-10-CM | POA: Diagnosis present

## 2023-09-30 DIAGNOSIS — O99214 Obesity complicating childbirth: Secondary | ICD-10-CM | POA: Diagnosis not present

## 2023-09-30 DIAGNOSIS — K219 Gastro-esophageal reflux disease without esophagitis: Secondary | ICD-10-CM | POA: Diagnosis present

## 2023-09-30 DIAGNOSIS — O321XX0 Maternal care for breech presentation, not applicable or unspecified: Secondary | ICD-10-CM | POA: Diagnosis not present

## 2023-09-30 DIAGNOSIS — O9832 Other infections with a predominantly sexual mode of transmission complicating childbirth: Secondary | ICD-10-CM | POA: Diagnosis present

## 2023-09-30 DIAGNOSIS — F419 Anxiety disorder, unspecified: Secondary | ICD-10-CM | POA: Diagnosis present

## 2023-09-30 LAB — GROUP B STREP BY PCR: Group B strep by PCR: POSITIVE — AB

## 2023-09-30 LAB — CBC
HCT: 36.4 % (ref 36.0–46.0)
HCT: 29.8 % — ABNORMAL LOW (ref 36.0–46.0)
Hemoglobin: 11.7 g/dL — ABNORMAL LOW (ref 12.0–15.0)
Hemoglobin: 9.8 g/dL — ABNORMAL LOW (ref 12.0–15.0)
MCH: 28.1 pg (ref 26.0–34.0)
MCH: 28.6 pg (ref 26.0–34.0)
MCHC: 32.1 g/dL (ref 30.0–36.0)
MCHC: 32.9 g/dL (ref 30.0–36.0)
MCV: 87.5 fL (ref 80.0–100.0)
MCV: 86.9 fL (ref 80.0–100.0)
Platelets: 265 10*3/uL (ref 150–400)
Platelets: 231 10*3/uL (ref 150–400)
RBC: 4.16 MIL/uL (ref 3.87–5.11)
RBC: 3.43 MIL/uL — ABNORMAL LOW (ref 3.87–5.11)
RDW: 14.4 % (ref 11.5–15.5)
RDW: 14.3 % (ref 11.5–15.5)
WBC: 12.8 10*3/uL — ABNORMAL HIGH (ref 4.0–10.5)
WBC: 16.3 10*3/uL — ABNORMAL HIGH (ref 4.0–10.5)
nRBC: 0 % (ref 0.0–0.2)
nRBC: 0 % (ref 0.0–0.2)

## 2023-09-30 LAB — POCT FERN TEST: POCT Fern Test: POSITIVE

## 2023-09-30 LAB — ABO/RH: ABO/RH(D): O POS

## 2023-09-30 SURGERY — Surgical Case
Anesthesia: Spinal | Site: Abdomen

## 2023-09-30 MED ORDER — DIPHENHYDRAMINE HCL 50 MG/ML IJ SOLN
INTRAMUSCULAR | Status: DC | PRN
Start: 2023-09-30 — End: 2023-09-30
  Administered 2023-09-30: 25 mg via INTRAVENOUS

## 2023-09-30 MED ORDER — LACTATED RINGERS IV SOLN: INTRAVENOUS | Status: AC

## 2023-09-30 MED ORDER — BUPIVACAINE IN DEXTROSE 0.75-8.25 % IT SOLN
INTRATHECAL | Status: DC | PRN
Start: 2023-09-30 — End: 2023-09-30
  Administered 2023-09-30: 1.6 mL via INTRATHECAL

## 2023-09-30 MED ORDER — PRENATAL MULTIVITAMIN CH
1.0000 | ORAL_TABLET | Freq: Every day | ORAL | Status: DC
  Administered 2023-09-30 – 2023-10-02 (×3): 1 via ORAL
  Filled 2023-09-30 (×3): qty 1

## 2023-09-30 MED ORDER — DEXMEDETOMIDINE HCL IN NACL 400 MCG/100ML IV SOLN
INTRAVENOUS | Status: DC | PRN
Start: 2023-09-30 — End: 2023-09-30
  Administered 2023-09-30 (×2): 8 ug via INTRAVENOUS

## 2023-09-30 MED ORDER — FENTANYL CITRATE (PF) 100 MCG/2ML IJ SOLN
INTRAMUSCULAR | Status: DC | PRN
  Administered 2023-09-30: 85 ug via INTRAVENOUS

## 2023-09-30 MED ORDER — SIMETHICONE 80 MG PO CHEW
80.0000 mg | CHEWABLE_TABLET | Freq: Three times a day (TID) | ORAL | Status: DC
  Administered 2023-09-30 – 2023-10-02 (×5): 80 mg via ORAL
  Filled 2023-09-30 (×5): qty 1

## 2023-09-30 MED ORDER — OXYCODONE HCL 5 MG PO TABS: 5.0000 mg | ORAL_TABLET | Freq: Once | ORAL | Status: DC | PRN

## 2023-09-30 MED ORDER — MORPHINE SULFATE (PF) 0.5 MG/ML IJ SOLN
INTRAMUSCULAR | Status: DC | PRN
  Administered 2023-09-30: 1 mg via INTRAVENOUS

## 2023-09-30 MED ORDER — IBUPROFEN 600 MG PO TABS
600.0000 mg | ORAL_TABLET | Freq: Four times a day (QID) | ORAL | Status: DC
  Administered 2023-10-01 – 2023-10-02 (×5): 600 mg via ORAL
  Filled 2023-09-30 (×5): qty 1

## 2023-09-30 MED ORDER — DIBUCAINE (PERIANAL) 1 % EX OINT: 1.0000 | TOPICAL_OINTMENT | CUTANEOUS | Status: DC | PRN

## 2023-09-30 MED ORDER — LACTATED RINGERS IV SOLN: INTRAVENOUS | Status: DC

## 2023-09-30 MED ORDER — SENNOSIDES-DOCUSATE SODIUM 8.6-50 MG PO TABS
2.0000 | ORAL_TABLET | ORAL | Status: DC
  Administered 2023-10-01 – 2023-10-02 (×2): 2 via ORAL
  Filled 2023-09-30 (×3): qty 2

## 2023-09-30 MED ORDER — AMISULPRIDE (ANTIEMETIC) 5 MG/2ML IV SOLN: 10.0000 mg | Freq: Once | INTRAVENOUS | Status: DC | PRN

## 2023-09-30 MED ORDER — MORPHINE SULFATE (PF) 0.5 MG/ML IJ SOLN
INTRAMUSCULAR | Status: AC
  Filled 2023-09-30: qty 10

## 2023-09-30 MED ORDER — OXYTOCIN-SODIUM CHLORIDE 30-0.9 UT/500ML-% IV SOLN
INTRAVENOUS | Status: DC | PRN
  Administered 2023-09-30: 30 [IU] via INTRAVENOUS

## 2023-09-30 MED ORDER — MENTHOL 3 MG MT LOZG: 1.0000 | LOZENGE | OROMUCOSAL | Status: DC | PRN

## 2023-09-30 MED ORDER — LACTATED RINGERS IV BOLUS
1000.0000 mL | Freq: Once | INTRAVENOUS | Status: AC
  Administered 2023-09-30: 1000 mL via INTRAVENOUS

## 2023-09-30 MED ORDER — WITCH HAZEL-GLYCERIN EX PADS: 1.0000 | MEDICATED_PAD | CUTANEOUS | Status: DC | PRN

## 2023-09-30 MED ORDER — SODIUM CHLORIDE 0.9 % IV SOLN: INTRAVENOUS | Status: DC | PRN

## 2023-09-30 MED ORDER — CEFAZOLIN SODIUM-DEXTROSE 2-4 GM/100ML-% IV SOLN
2.0000 g | INTRAVENOUS | Status: AC
  Administered 2023-09-30: 2 g via INTRAVENOUS
  Filled 2023-09-30: qty 100

## 2023-09-30 MED ORDER — SOD CITRATE-CITRIC ACID 500-334 MG/5ML PO SOLN
30.0000 mL | ORAL | Status: AC
  Administered 2023-09-30: 30 mL via ORAL
  Filled 2023-09-30: qty 30

## 2023-09-30 MED ORDER — FENTANYL CITRATE (PF) 100 MCG/2ML IJ SOLN
INTRAMUSCULAR | Status: AC
  Filled 2023-09-30: qty 2

## 2023-09-30 MED ORDER — DIPHENHYDRAMINE HCL 25 MG PO CAPS: 25.0000 mg | ORAL_CAPSULE | Freq: Four times a day (QID) | ORAL | Status: DC | PRN

## 2023-09-30 MED ORDER — SODIUM CHLORIDE 0.9 % IV SOLN: 12.5000 mg | INTRAVENOUS | Status: DC | PRN

## 2023-09-30 MED ORDER — METOCLOPRAMIDE HCL 5 MG/ML IJ SOLN
INTRAMUSCULAR | Status: DC | PRN
Start: 2023-09-30 — End: 2023-09-30
  Administered 2023-09-30: 10 mg via INTRAVENOUS

## 2023-09-30 MED ORDER — OXYCODONE HCL 5 MG/5ML PO SOLN: 5.0000 mg | Freq: Once | ORAL | Status: DC | PRN

## 2023-09-30 MED ORDER — SIMETHICONE 80 MG PO CHEW: 80.0000 mg | CHEWABLE_TABLET | ORAL | Status: DC | PRN

## 2023-09-30 MED ORDER — ONDANSETRON HCL 4 MG/2ML IJ SOLN
INTRAMUSCULAR | Status: DC | PRN
  Administered 2023-09-30: 4 mg via INTRAVENOUS

## 2023-09-30 MED ORDER — OXYCODONE HCL 5 MG PO TABS
5.0000 mg | ORAL_TABLET | ORAL | Status: DC | PRN
  Administered 2023-09-30: 10 mg via ORAL
  Filled 2023-09-30: qty 2

## 2023-09-30 MED ORDER — MEPERIDINE HCL 25 MG/ML IJ SOLN: 6.2500 mg | INTRAMUSCULAR | Status: DC | PRN

## 2023-09-30 MED ORDER — STERILE WATER FOR IRRIGATION IR SOLN
Status: DC | PRN
Start: 2023-09-30 — End: 2023-09-30
  Administered 2023-09-30: 1

## 2023-09-30 MED ORDER — COCONUT OIL OIL: 1.0000 | TOPICAL_OIL | Status: DC | PRN

## 2023-09-30 MED ORDER — OXYTOCIN-SODIUM CHLORIDE 30-0.9 UT/500ML-% IV SOLN: 2.5000 [IU]/h | INTRAVENOUS | Status: AC

## 2023-09-30 MED ORDER — ACETAMINOPHEN 500 MG PO TABS
1000.0000 mg | ORAL_TABLET | Freq: Four times a day (QID) | ORAL | Status: DC
  Administered 2023-09-30 – 2023-10-02 (×9): 1000 mg via ORAL
  Filled 2023-09-30 (×9): qty 2

## 2023-09-30 MED ORDER — OXYTOCIN-SODIUM CHLORIDE 30-0.9 UT/500ML-% IV SOLN
INTRAVENOUS | Status: AC
  Filled 2023-09-30: qty 500

## 2023-09-30 MED ORDER — FENTANYL CITRATE (PF) 100 MCG/2ML IJ SOLN
25.0000 ug | INTRAMUSCULAR | Status: DC | PRN
  Administered 2023-09-30 (×2): 50 ug via INTRAVENOUS

## 2023-09-30 MED ORDER — KETOROLAC TROMETHAMINE 30 MG/ML IJ SOLN
30.0000 mg | Freq: Four times a day (QID) | INTRAMUSCULAR | Status: AC
  Administered 2023-09-30 – 2023-10-01 (×4): 30 mg via INTRAVENOUS
  Filled 2023-09-30 (×4): qty 1

## 2023-09-30 MED ORDER — DEXAMETHASONE SODIUM PHOSPHATE 10 MG/ML IJ SOLN
INTRAMUSCULAR | Status: DC | PRN
  Administered 2023-09-30: 10 mg via INTRAVENOUS

## 2023-09-30 MED ORDER — ACETAMINOPHEN 10 MG/ML IV SOLN
INTRAVENOUS | Status: DC | PRN
Start: 2023-09-30 — End: 2023-09-30
  Administered 2023-09-30: 1000 mg via INTRAVENOUS

## 2023-09-30 MED ORDER — PHENYLEPHRINE HCL (PRESSORS) 10 MG/ML IV SOLN
INTRAVENOUS | Status: DC | PRN
Start: 2023-09-30 — End: 2023-09-30
  Administered 2023-09-30: 80 ug via INTRAVENOUS
  Administered 2023-09-30: 160 ug via INTRAVENOUS

## 2023-09-30 MED ORDER — TETANUS-DIPHTH-ACELL PERTUSSIS 5-2.5-18.5 LF-MCG/0.5 IM SUSY: 0.5000 mL | PREFILLED_SYRINGE | Freq: Once | INTRAMUSCULAR | Status: DC

## 2023-09-30 MED ORDER — KETOROLAC TROMETHAMINE 30 MG/ML IJ SOLN
INTRAMUSCULAR | Status: DC | PRN
Start: 2023-09-30 — End: 2023-09-30
  Administered 2023-09-30: 30 mg via INTRAVENOUS

## 2023-09-30 MED ORDER — PHENYLEPHRINE HCL-NACL 20-0.9 MG/250ML-% IV SOLN
INTRAVENOUS | Status: DC | PRN
  Administered 2023-09-30: 60 ug/min via INTRAVENOUS

## 2023-09-30 MED ORDER — SODIUM CHLORIDE 0.9 % IV SOLN
500.0000 mg | INTRAVENOUS | Status: AC
  Administered 2023-09-30: 500 mg via INTRAVENOUS

## 2023-09-30 SURGICAL SUPPLY — 41 items
APL PRP STRL LF DISP 70% ISPRP (MISCELLANEOUS) ×2
APL SKNCLS STERI-STRIP NONHPOA (GAUZE/BANDAGES/DRESSINGS) ×1
BENZOIN TINCTURE PRP APPL 2/3 (GAUZE/BANDAGES/DRESSINGS) IMPLANT
CHLORAPREP W/TINT 26 (MISCELLANEOUS) ×2 IMPLANT
CLAMP UMBILICAL CORD (MISCELLANEOUS) ×1 IMPLANT
CLOTH BEACON ORANGE TIMEOUT ST (SAFETY) ×1 IMPLANT
DRAPE C SECTION CLR SCREEN (DRAPES) ×1 IMPLANT
DRSG OPSITE POSTOP 4X10 (GAUZE/BANDAGES/DRESSINGS) ×1 IMPLANT
ELECT REM PT RETURN 9FT ADLT (ELECTROSURGICAL) ×1
ELECTRODE REM PT RTRN 9FT ADLT (ELECTROSURGICAL) ×1 IMPLANT
EXTRACTOR VACUUM KIWI (MISCELLANEOUS) IMPLANT
GAUZE SPONGE 4X4 12PLY STRL LF (GAUZE/BANDAGES/DRESSINGS) IMPLANT
GLOVE BIO SURGEON STRL SZ 6.5 (GLOVE) ×1 IMPLANT
GLOVE BIOGEL PI IND STRL 7.0 (GLOVE) ×2 IMPLANT
GOWN STRL REUS W/TWL LRG LVL3 (GOWN DISPOSABLE) ×2 IMPLANT
KIT ABG SYR 3ML LUER SLIP (SYRINGE) IMPLANT
MAT PREVALON FULL STRYKER (MISCELLANEOUS) IMPLANT
NDL HYPO 25X5/8 SAFETYGLIDE (NEEDLE) IMPLANT
NEEDLE HYPO 25X5/8 SAFETYGLIDE (NEEDLE) IMPLANT
NS IRRIG 1000ML POUR BTL (IV SOLUTION) ×1 IMPLANT
PACK C SECTION WH (CUSTOM PROCEDURE TRAY) ×1 IMPLANT
PAD OB MATERNITY 4.3X12.25 (PERSONAL CARE ITEMS) ×1 IMPLANT
RETRACTOR WND ALEXIS 25 LRG (MISCELLANEOUS) ×1 IMPLANT
RTRCTR C-SECT PINK 25CM LRG (MISCELLANEOUS) IMPLANT
RTRCTR WOUND ALEXIS 25CM LRG (MISCELLANEOUS) ×1
STRIP CLOSURE SKIN 1/2X4 (GAUZE/BANDAGES/DRESSINGS) IMPLANT
SUT CHROMIC 1 CTX 36 (SUTURE) ×2 IMPLANT
SUT PLAIN 0 NONE (SUTURE) IMPLANT
SUT PLAIN 2 0 XLH (SUTURE) ×1 IMPLANT
SUT VIC AB 0 CT1 27 (SUTURE) ×2
SUT VIC AB 0 CT1 27XBRD ANBCTR (SUTURE) ×2 IMPLANT
SUT VIC AB 0 CTX 36 (SUTURE) ×1
SUT VIC AB 0 CTX36XBRD ANBCTRL (SUTURE) IMPLANT
SUT VIC AB 2-0 CT1 27 (SUTURE) ×1
SUT VIC AB 2-0 CT1 TAPERPNT 27 (SUTURE) ×1 IMPLANT
SUT VIC AB 3-0 CT1 27 (SUTURE)
SUT VIC AB 3-0 CT1 TAPERPNT 27 (SUTURE) IMPLANT
SUT VIC AB 4-0 KS 27 (SUTURE) ×1 IMPLANT
TOWEL OR 17X24 6PK STRL BLUE (TOWEL DISPOSABLE) ×1 IMPLANT
TRAY FOLEY W/BAG SLVR 14FR LF (SET/KITS/TRAYS/PACK) ×1 IMPLANT
WATER STERILE IRR 1000ML POUR (IV SOLUTION) ×1 IMPLANT

## 2023-09-30 NOTE — Anesthesia Preprocedure Evaluation (Addendum)
Anesthesia Evaluation  Patient identified by MRN, date of birth, ID band Patient awake    Reviewed: Allergy & Precautions, NPO status , Patient's Chart, lab work & pertinent test results  History of Anesthesia Complications Negative for: history of anesthetic complications  Airway Mallampati: II  TM Distance: >3 FB Neck ROM: Full    Dental  (+) Dental Advisory Given, Teeth Intact   Pulmonary former smoker   Pulmonary exam normal        Cardiovascular negative cardio ROS Normal cardiovascular exam     Neuro/Psych  PSYCHIATRIC DISORDERS Anxiety  Bipolar Disorder   negative neurological ROS     GI/Hepatic Neg liver ROS,GERD  Controlled and Medicated,,  Endo/Other    Morbid obesity  Renal/GU negative Renal ROS     Musculoskeletal negative musculoskeletal ROS (+)    Abdominal   Peds  Hematology  (+) Blood dyscrasia, anemia  Plt 255k    Anesthesia Other Findings   Reproductive/Obstetrics (+) Pregnancy                             Anesthesia Physical Anesthesia Plan  ASA: 3  Anesthesia Plan: Spinal   Post-op Pain Management: Tylenol PO (pre-op)*   Induction:   PONV Risk Score and Plan: 2 and Treatment may vary due to age or medical condition, Ondansetron and Scopolamine patch - Pre-op  Airway Management Planned: Natural Airway  Additional Equipment: None  Intra-op Plan:   Post-operative Plan:   Informed Consent: I have reviewed the patients History and Physical, chart, labs and discussed the procedure including the risks, benefits and alternatives for the proposed anesthesia with the patient or authorized representative who has indicated his/her understanding and acceptance.       Plan Discussed with: CRNA and Anesthesiologist  Anesthesia Plan Comments: (Labs reviewed, platelets acceptable. Discussed risks and benefits of spinal, including spinal/epidural hematoma,  infection, failed block, and PDPH. Patient expressed understanding and wished to proceed. )       Anesthesia Quick Evaluation

## 2023-09-30 NOTE — MAU Note (Signed)
Dr. Timothy Lasso at bedside

## 2023-09-30 NOTE — Anesthesia Procedure Notes (Signed)
Spinal  Patient location during procedure: OR Start time: 09/30/2023 5:08 AM End time: 09/30/2023 5:11 AM Reason for block: surgical anesthesia Staffing Performed: anesthesiologist  Anesthesiologist: Beryle Lathe, MD Performed by: Beryle Lathe, MD Authorized by: Beryle Lathe, MD   Preanesthetic Checklist Completed: patient identified, IV checked, risks and benefits discussed, surgical consent, monitors and equipment checked, pre-op evaluation and timeout performed Spinal Block Patient position: sitting Prep: DuraPrep Patient monitoring: heart rate, cardiac monitor, continuous pulse ox and blood pressure Approach: midline Location: L3-4 Injection technique: single-shot Needle Needle type: Pencan  Needle gauge: 24 G Additional Notes Consent was obtained prior to the procedure with all questions answered and concerns addressed. Risks including, but not limited to, bleeding, infection, nerve damage, paralysis, failed block, inadequate analgesia, allergic reaction, high spinal, itching, and headache were discussed and the patient wished to proceed. Functioning IV was confirmed and monitors were applied. Sterile prep and drape, including hand hygiene, mask, and sterile gloves were used. The patient was positioned and the spine was prepped. The skin was anesthetized with lidocaine. Free flow of clear CSF was obtained prior to injecting local anesthetic into the CSF. The spinal needle aspirated freely following injection. The needle was carefully withdrawn. The patient tolerated the procedure well.   Leslye Peer, MD

## 2023-09-30 NOTE — MAU Note (Addendum)
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 Denies any S/S now

## 2023-09-30 NOTE — Progress Notes (Addendum)
Post Partum Day 0 Subjective: tolerating PO, + flatus, and lochia mild. Still has SCDs on. Has not ambulated yet. Bonding well with bbay - breastfeeding. Ordering breakfast now Reports general soreness around lower abdomen as expected. No fever or chills  Objective: Blood pressure 120/82, pulse 80, temperature 98 F (36.7 C), temperature source Oral, resp. rate 16, height 5\' 5"  (1.651 m), weight 118.8 kg, SpO2 97%, unknown if currently breastfeeding.  Physical Exam:  General: alert, cooperative, and no distress Lochia: appropriate Uterine Fundus: firm Incision: no significant drainage DVT Evaluation: No evidence of DVT seen on physical exam. Calf/Ankle edema is present.  Recent Labs    09/29/23 1059 09/30/23 0200  HGB 11.9* 11.7*  HCT 36.6 36.4    Assessment/Plan: Breastfeeding Routine pp/post op care Will take home pristiq as none avail in hospital.     LOS: 0 days   Amy Melton W Kaula Klenke, DO 09/30/2023, 10:51 AM

## 2023-09-30 NOTE — Plan of Care (Signed)
CHL Tonsillectomy/Adenoidectomy, Postoperative PEDS care plan entered in error.

## 2023-09-30 NOTE — Lactation Note (Signed)
This note was copied from a baby's chart. Lactation Consultation Note  Patient Name: Amy Melton NUUVO'[id number] Date: 09/30/2023 Age:29 hours Reason for consult: Initial assessment;1st time breastfeeding;Early term 37-38.6wks  P1, Baby 37 weeks.  Baby sucking on pacifier when LC entered room.  Pacifier use not recommended at this time.  Provided education.  Reviewed hand expression.  FOB was able to hand express easily. Assisted with latching off and on for 10 min.  Used teacup hold to achieve a deeper latch.  Returned to room to assist with feeding and pumping. Set up DEBP with 18 mm flanges.  Mother pumped drops which were given to baby.   Assisted with latching.  Baby latched but fell asleep quickly.  Parents offered donor milk or formula and chose formula for supplementation. Plan: Keep baby STS as much as possible  Offer breast when baby cues that he/she is hungry, or awaken baby for feeding at 3 hrs.  Breast feed baby, asking for help prn.  Limit to 30 mins so not to overtire baby. If baby does not latch after 10 min of attempt - give supplemental breastmilk/formula.  Slow flow nipple bottle is an option.   Pump both breasts 15 minutes on initiation setting, adding hand expression to collect as much colostrum as possible to feed baby.  Feed baby 5-61ml EBM+/formula after breastfeeding per LPTI volume guidelines increasing per day of life and as baby desires.     Maternal Data Has patient been taught Hand Expression?: Yes Does the patient have breastfeeding experience prior to this delivery?: No  Feeding Mother's Current Feeding Choice: Breast Milk  LATCH Score Latch: Repeated attempts needed to sustain latch, nipple held in mouth throughout feeding, stimulation needed to elicit sucking reflex.  Audible Swallowing: A few with stimulation  Type of Nipple: Everted at rest and after stimulation (short shaft)  Comfort (Breast/Nipple): Soft / non-tender  Hold (Positioning):  Assistance needed to correctly position infant at breast and maintain latch.  LATCH Score: 7   Lactation Tools Discussed/Used  DEBP  Interventions Interventions: Breast feeding basics reviewed;Assisted with latch;Skin to skin;Hand express;Adjust position;Support pillows;Position options;DEBP;Education;LC Services brochure  Discharge Pump: Personal Theatre stage manager Amy Melton)  Consult Status Consult Status: Follow-up Date: 10/01/23 Follow-up type: In-patient    Dahlia Byes [facility] 09/30/2023, 1:04 PM

## 2023-09-30 NOTE — Progress Notes (Signed)
Patient ID: Amy Melton, female   DOB: 02/03/1996, 27 y.o.   MRN: [id number] Late entry  I was called and informed that pt had positive  orthostatic results: BP increased > 15points over baseline with position change though HR remained stable/in normal range and pt was asymptomatic.  Repeat done two hours later showed same results; CBC was as expected with Hg of 9.8 ( from 11.7 preop)   Given nl labs and pt asymptomatic, recommend no intervention at this time.   Will repeat CBC in am to recheck wbc and Hg.   Routine pp care

## 2023-09-30 NOTE — Op Note (Signed)
CESAREAN SECTION Procedure Note  Patient: Amy Melton is a 27 y.o. G1P1001 @ [redacted]w[redacted]d  Preoperative Diagnosis:  Intrauterine pregnancy at 37 weeks 0 days Frank breech presentation Spontaneous rupture of membranes Gestational hypertension  Postoperative Diagnosis: same, delivered  Procedure: Primary low transverse cesarean delivery     Surgeon: Charlett Nose, MD  Assistant: Wyn Forster, MD  An experienced assistant was required given the standard of surgical care given the complexity of the case.  This assistant was needed for exposure, dissection, suctioning, retraction, instrument exchange, assisting with delivery with administration of fundal pressure, and for overall help during the procedure.  Anesthesia: Spinal anesthesia   Findings: Normal appearing uterus, fallopian tubes bilaterally, and ovaries bilaterally.  Viable female infant in frank breech presentation delivered at 0538 with weight 3410g (7 pounds 8.3 ounces) Apgars 9 and 9.  Estimated Blood Loss:          Specimens: Placenta to L&D for disposal         Complications:  None         Disposition: PACU - hemodynamically stable.         Condition: stable    Description of Procedure: The patient was taken to the operating room where spinal anesthesia was placed and found to be adequate.  The patient was placed in the dorsal supine position.  Fetal heart tones were confirmed. Thromboguards were applied and cycling. A foley catheter was inserted and draining. Ancef 2g and azythromycin 500mg  were given for infection prophylaxis. The patient was subsequently prepped and draped in the normal sterile fashion.    A low transverse skin incision was made with a scalpel and carried down to the level of the fascia.  The fascia was incised in the midline with the scalpel and extended laterally with curved Mayo scissors.  Kocher clamps were applied to the inferior fascial edge and the fascia was dissected off the rectus  muscle sharply using the Mayo scissors.  The Kocher clamps were transferred to the superior fascial edge and the underlying rectus muscle was dissected off with curved Mayo's scissors.  The rectus muscles then were separated in the midline.  The peritoneum was found free of adherent bowel and the peritoneal cavity was entered bluntly.  The uterus was identified and the alexis retractor was placed intraperitoneal.  A bladder flap was then created sharply with Metzenbaum scissors and separated from the lower uterine segment digitally.   A low transverse hysterotomy was then made with a scalpel.  The infant was found in the frank breech presentation was delivered atraumatically and without difficulty with standard breech maneuvers.  After 60 seconds of delayed cord clamping the cord was clamped and cut and the infant was handed off to the pediatricians.  The placenta was delivered with gentle traction on umbilical cord and manual massage of the uterine fundus.  The uterus was cleared of all clot and debris.  The hysterotomy was then closed with 0 monocryl in a running locked fashion,  followed by 0 Monocryl in an imbricating fashion.  The hysterotomy was found to be hemostatic. The peritoneum was closed with 3-0 vicryl in a running fashion. The fascia was closed with a 0 Vicryl suture in a continuous running fashion.  The subcutaneous tissue was irrigated and rendered hemostatic with cautery.  The subcutaneous layer was subsequently closed with 3-0 Vicryl in a continuous running fashion.  The skin was closed with 4-0 vicryl  in a running subcuticular fashion.  Sponge, lap and  needle counts were correct. Steri strips and a Honeycomb dressing were placed on the incision.  Charlett Nose 09/30/23  6:16 AM

## 2023-09-30 NOTE — H&P (Signed)
Amy Melton is a 27 y.o. female G1P0 [redacted]w[redacted]d presenting for leakage of fluid at midnight. She reports no VB, Contractions. Normal FM.   Patient was seen in MAU yesterday and diagnosed with gestational hypertension. Due to breech presentation, plan was for ECV this morning and delivery.   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 Social History:  reports that she has quit smoking. She has never used smokeless tobacco. She reports that she does not currently use alcohol after a past usage of about 7.0 standard drinks of alcohol per week. She reports that she does not use drugs.     Maternal Diabetes: No Genetic Screening: Normal Maternal Ultrasounds/Referrals: Normal Fetal Ultrasounds or other Referrals:  None Maternal Substance Abuse:  No Significant Maternal Medications:  Meds include: Other: Pristiq, valtrex Significant Maternal Lab Results:  Other: GBS unknown Other Comments:  None Vaccines: TDAP, flu, RSV  Review of Systems Per HPI Exam Physical Exam    Blood pressure (!) 148/86, pulse (!) 103, temperature 98.7 F (37.1 C), temperature source Oral, resp. rate 16, height 5\' 5"  (1.651 m), weight 118.8 kg. Gen: NAD, resting comfortably CVS: normal pulses Lungs: Nonlabored respirations Abd: Gravid abdomen Ext: no calf edema or tenderness Fetal testing: 140bpm, mod variability, + accels, no decels Toco: ctx q 3-6  mins  Bedside US for presentation only: complete breech  Prenatal labs: ABO, Rh:  --/--/O POS (10/18 1054) Antibody: NEG (10/18 1054) Rubella: Immune (04/09 0000) RPR: NON REACTIVE (10/18 1059)  HBsAg: Negative (04/09 0000)  HIV: Non-reactive (04/09 0000)  GBS:   pending  Assessment/Plan: 27Y G1P0 @ [redacted]w[redacted]d, PROM, breech, new diagnosis of GHTN 1. Fetal wellbeing: cat I tracing 2. Breech presentation, PROM: plan for cesarean delivery. She last ate at 2100 (cereal), plan for cesarean 8 hours after last eating per [facility] anesthesiologist, therefore will plan for 0500 this morning. She is not currently feeling contractions, advised patient to notify staff sooner if she becomes uncomfortable. Reviewed consent with patient and her partner. Reviewed risks of cesarean including infection, bleeding, damage to surrounding organs, blood transfusion, hysterectomy, injury to infant. All questions answered. Consent signed. 3. GHTN: labs done yesterday normal, monitor for signs of preeclampsia 4. GBS unknown: collected yesterday in office, rapid GBS ordered  Derl Barrow MD 09/30/23 2:03 AM    Charlett Nose 09/30/2023, 2:00 AM

## 2023-09-30 NOTE — Transfer of Care (Signed)
Immediate Anesthesia Transfer of Care Note  Patient: Amy Melton  Procedure(s) Performed: CESAREAN SECTION (Abdomen)  Patient Location: PACU  Anesthesia Type:Spinal  Level of Consciousness: awake, alert , oriented, and sedated  Airway & Oxygen Therapy: Patient Spontanous Breathing  Post-op Assessment: Report given to RN and Post -op Vital signs reviewed and stable  Post vital signs: Reviewed and stable  Last Vitals:  Vitals Value Taken Time  BP 118/81 09/30/23 0630  Temp    Pulse 86 09/30/23 0634  Resp 16 09/30/23 0634  SpO2 96 % 09/30/23 0634  Vitals shown include unfiled device data.  Last Pain:  Vitals:   09/30/23 0350  TempSrc: Oral  PainSc:          Complications: No notable events documented.

## 2023-10-01 LAB — CBC
HCT: 27.6 % — ABNORMAL LOW (ref 36.0–46.0)
Hemoglobin: 9.1 g/dL — ABNORMAL LOW (ref 12.0–15.0)
MCH: 28.6 pg (ref 26.0–34.0)
MCHC: 33 g/dL (ref 30.0–36.0)
MCV: 86.8 fL (ref 80.0–100.0)
Platelets: 166 10*3/uL (ref 150–400)
RBC: 3.18 MIL/uL — ABNORMAL LOW (ref 3.87–5.11)
RDW: 14.5 % (ref 11.5–15.5)
WBC: 12.4 10*3/uL — ABNORMAL HIGH (ref 4.0–10.5)
nRBC: 0 % (ref 0.0–0.2)

## 2023-10-01 NOTE — Progress Notes (Signed)
Subjective: Postpartum Day 1: Cesarean Delivery Patient reports tolerating PO, + flatus, + BM, and no problems voiding.  She is tired but overall feels better than she did yesterday. She is bonding well with baby. C/O numbness in hands - begun during pregnancy  Objective: Vital signs in last 24 hours: Temp:  [98 F (36.7 C)-98.2 F (36.8 C)] 98 F (36.7 C) (10/20 0630) Pulse Rate:  [75-91] 91 (10/20 0630) Resp:  [16-18] 16 (10/20 0630) BP: (114-134)/(74-84) 132/84 (10/20 0630) SpO2:  [96 %-100 %] 99 % (10/20 0630)  Physical Exam:  General: cooperative, no distress, and alert once awake Lochia: appropriate Uterine Fundus: firm Incision: no significant drainage DVT Evaluation: No evidence of DVT seen on physical exam.  Recent Labs    09/30/23 1609 10/01/23 0417  HGB 9.8* 9.1*  HCT 29.8* 27.6*    Assessment/Plan: Status post Cesarean section. Doing well postoperatively.  Continue current care. Counseled on carpal tunnel - rec braces and stress ball CBC - improved wbc, stable Hg  Amy Melton Amy Melton Amy Nowaczyk, DO 10/01/2023, 8:58 AM

## 2023-10-01 NOTE — Social Work (Signed)
MOB was referred for history of depression/anxiety. Referral screened out by Clinical Social Worker because none of the following criteria appear to apply: ~ History of anxiety/depression during this pregnancy, or of post-partum depression following prior delivery.  * MOB's symptoms currently being treated with medication and/or therapy. Please contact the Clinical Social Worker if needs arise, by [facility] request, or if MOB scores greater than 9/yes to question 10 on Edinburgh Postpartum Depression Screen.   Jimmy Picket, LCSW Clinical Social Worker

## 2023-10-01 NOTE — Anesthesia Postprocedure Evaluation (Signed)
Anesthesia Post Note  Patient: Amy Melton  Procedure(s) Performed: CESAREAN SECTION (Abdomen)     Anesthesia Type: Spinal Anesthetic complications: no   No notable events documented.  Last Vitals:  Vitals:   09/30/23 1538 09/30/23 2030  BP: 134/74 122/74  Pulse: 82 75  Resp:  16  Temp:  36.8 C  SpO2:  100%    Last Pain:  Vitals:   09/30/23 2030  TempSrc: Axillary  PainSc: 1    Pain Goal: Patients Stated Pain Goal: 0 (09/30/23 0700)                 Beryle Lathe

## 2023-10-02 ENCOUNTER — Encounter (HOSPITAL_COMMUNITY): Payer: Self-pay | Admitting: Obstetrics and Gynecology

## 2023-10-02 MED ORDER — OXYCODONE HCL 5 MG PO TABS: 5.0000 mg | ORAL_TABLET | ORAL | 0 refills | Status: AC | PRN

## 2023-10-02 MED ORDER — ACETAMINOPHEN 325 MG PO TABS: 650.0000 mg | ORAL_TABLET | Freq: Four times a day (QID) | ORAL | Status: AC | PRN

## 2023-10-02 MED ORDER — IBUPROFEN 600 MG PO TABS: 600.0000 mg | ORAL_TABLET | Freq: Four times a day (QID) | ORAL | 1 refills | Status: AC

## 2023-10-02 NOTE — Progress Notes (Signed)
Subjective: Postpartum Day 2: Cesarean Delivery Patient is doing well this morning. Pain is controlled. Ambulating, voiding, tolerating PO. Minimal lochia. Passing flatus. Breastfeeding.   Objective: Patient Vitals for the past 24 hrs:  BP Temp Temp src Pulse Resp SpO2  10/02/23 0510 131/79 98.6 F (37 C) Oral 78 18 99 %  10/01/23 2037 132/75 98.4 F (36.9 C) Oral 83 16 99 %  10/01/23 1500 132/85 98.8 F (37.1 C) Oral 90 16 99 %    Physical Exam:  General: alert, cooperative, and no distress Lochia: appropriate Uterine Fundus: firm Incision: healing well, no significant drainage, no dehiscence, no significant erythema DVT Evaluation: No evidence of DVT seen on physical exam.  Recent Labs    09/30/23 1609 10/01/23 0417  HGB 9.8* 9.1*  HCT 29.8* 27.6*    Assessment/Plan: Amy Melton G1P1001 POD#2 sp PCS at [redacted]w[redacted]d for breech, ROM 1. PPC: routine PP care 2. GHTN: normotensive, no meds 3. Dispo: meeting all postop milestones, ready for discharge home. Instructions reviewed. BP/incision check in office in 1 week  Charlett Nose 10/02/2023, 9:26 AM

## 2023-10-02 NOTE — Discharge Summary (Signed)
Postpartum Discharge Summary  Date of Service updated 10/02/23      Patient Name: Amy Melton DOB: 27-Aug-1996 MRN: [id number]  Date of admission: 09/30/2023 Delivery date:09/30/2023 Delivering provider: Derl Barrow E Date of discharge: 10/02/2023  Admitting diagnosis: Breech presentation [O32.1XX0] Intrauterine pregnancy: [redacted]w[redacted]d     Secondary diagnosis:  Principal Problem:   Breech presentation  Additional problems: GHTN, PROM    Discharge diagnosis: Term Pregnancy Delivered and Gestational Hypertension                                              Post partum procedures: none Augmentation: N/A Complications: None  Hospital course: Sceduled C/S   27 y.o. yo G1P1001 at [redacted]w[redacted]d was admitted to the hospital 09/30/2023 for cesarean section with the following indication: breech, PROM, GHTN .Delivery details are as follows:  Membrane Rupture Time/Date: 5:37 AM,09/30/2023  Delivery Method:C-Section, Low Transverse Operative Delivery:N/A Details of operation can be found in separate operative note.  Patient had a postpartum course complicated by nothing.  She is ambulating, tolerating a regular diet, passing flatus, and urinating well. Patient is discharged home in stable condition on  10/02/23        Newborn Data: Birth date:09/30/2023 Birth time:5:38 AM Gender:Female Living status:Living Apgars: ,  Weight:3410 g    Magnesium Sulfate received: No BMZ received: No Rhophylac:N/A MMR:N/A T-DaP:Given prenatally Flu: Given prenatally RSV Vaccine received: Given prenatally Transfusion:No Immunizations administered: Immunization History  Administered Date(s) Administered   Influenza,inj,Quad PF,6+ Mos 08/22/2018   Moderna Sars-Covid-2 Vaccination 06/05/2021   Pfizer(Comirnaty)Fall Seasonal Vaccine 12 years and older 10/14/2022   Rsv, Bivalent, Protein Subunit Rsvpref,pf Verdis Frederickson) 09/05/2023    Physical exam  Vitals:   10/01/23 0630 10/01/23 1500 10/01/23 2037  10/02/23 0510  BP: 132/84 132/85 132/75 131/79  Pulse: 91 90 83 78  Resp: 16 16 16 18   Temp: 98 F (36.7 C) 98.8 F (37.1 C) 98.4 F (36.9 C) 98.6 F (37 C)  TempSrc:  Oral Oral Oral  SpO2: 99% 99% 99% 99%  Weight:      Height:       General: alert, cooperative, and no distress Lochia: appropriate Uterine Fundus: firm Incision: Healing well with no significant drainage DVT Evaluation: No evidence of DVT seen on physical exam. Labs: Lab Results  Component Value Date   WBC 12.4 (H) 10/01/2023   HGB 9.1 (L) 10/01/2023   HCT 27.6 (L) 10/01/2023   MCV 86.8 10/01/2023   PLT 166 10/01/2023      Latest Ref Rng & Units 09/29/2023   10:59 AM  CMP  Glucose 70 - 99 mg/dL 90   BUN 6 - 20 mg/dL <5   Creatinine 5.28 - 1.00 mg/dL 4.13   Sodium 244 - 010 mmol/L 134   Potassium 3.5 - 5.1 mmol/L 3.7   Chloride 98 - 111 mmol/L 103   CO2 22 - 32 mmol/L 19   Calcium 8.9 - 10.3 mg/dL 9.1   Total Protein 6.5 - 8.1 g/dL 6.5   Total Bilirubin 0.3 - 1.2 mg/dL 0.4   Alkaline Phos 38 - 126 U/L 108   AST 15 - 41 U/L 17   ALT 0 - 44 U/L 12    Edinburgh Score:    09/30/2023    9:14 AM  Edinburgh Postnatal Depression Scale Screening Tool  I have been able to laugh and  see the funny side of things. 0  I have looked forward with enjoyment to things. 0  I have blamed myself unnecessarily when things went wrong. 1  I have been anxious or worried for no good reason. 1  I have felt scared or panicky for no good reason. 1  Things have been getting on top of me. 1  I have been so unhappy that I have had difficulty sleeping. 1  I have felt sad or miserable. 0  I have been so unhappy that I have been crying. 0  The thought of harming myself has occurred to me. 0  Edinburgh Postnatal Depression Scale Total 5      After visit meds:  Allergies as of 10/02/2023   No Known Allergies      Medication List     STOP taking these medications    aspirin EC 81 MG tablet   valACYclovir 500 MG  tablet Commonly known as: VALTREX       TAKE these medications    acetaminophen 325 MG tablet Commonly known as: TYLENOL Take 2 tablets (650 mg total) by mouth every 6 (six) hours as needed.   cetirizine 10 MG chewable tablet Commonly known as: ZYRTEC Chew 10 mg by mouth daily.   desvenlafaxine 100 MG 24 hr tablet Commonly known as: PRISTIQ Take 100 mg by mouth daily.   ibuprofen 600 MG tablet Commonly known as: ADVIL Take 1 tablet (600 mg total) by mouth every 6 (six) hours.   oxyCODONE 5 MG immediate release tablet Commonly known as: Oxy IR/ROXICODONE Take 1 tablet (5 mg total) by mouth every 4 (four) hours as needed for moderate pain (pain score 4-6) or severe pain (pain score 7-10).   prenatal multivitamin Tabs tablet Take 1 tablet by mouth daily at 12 noon.         Discharge home in stable condition Infant Feeding: Breast Infant Disposition:home with mother Discharge instruction: per After Visit Summary and Postpartum booklet. Activity: Advance as tolerated. Pelvic rest for 6 weeks.  Diet: routine diet Anticipated Birth Control: Unsure Postpartum Appointment:6 weeks Additional Postpartum F/U: Incision check 1 week and BP check 1 week Future Appointments:No future appointments. Follow up Visit:  Follow-up Information     [facility] Schedule an appointment as soon as possible for a visit in 1 week(s).   Why: Incision and blood pressure check Contact information: [address]  [address] [place] [address] [phone]                     10/02/2023 Charlett Nose, MD

## 2023-10-02 NOTE — Lactation Note (Signed)
This note was copied from a baby's chart. Lactation Consultation Note  Patient Name: Amy Melton Date: 10/02/2023 Age:29 hours Reason for consult: Follow-up assessment;1st time breastfeeding;Early term 37-38.6wks  P1, Baby 37 weeks and has bee sleepy at the breast.  Baby is primarily being formula fed but would like to go home and try breastfeeding and pumping.  Discussed plan and LPI feeding behavior.  Encouraged OP appt.     Maternal Data Has patient been taught Hand Expression?: Yes Does the patient have breastfeeding experience prior to this delivery?: No  Feeding Mother's Current Feeding Choice: Breast Milk and Formula Nipple Type: Slow - flow  Lactation Tools Discussed/Used Tools: Flanges;Pump Breast pump type: Double-Electric Breast Pump;Manual Pumping frequency:  (q 3 hours 15 min)  Interventions Interventions: Education;DEBP  Discharge Discharge Education: Engorgement and breast care;Warning signs for feeding baby Pump: Personal Doylene Canning)  Consult Status Consult Status: Complete Date: 10/02/23    Dahlia Byes [facility] 10/02/2023, 11:08 AM

## 2023-10-03 ENCOUNTER — Encounter (HOSPITAL_COMMUNITY): Payer: Self-pay | Admitting: Obstetrics and Gynecology

## 2023-10-18 ENCOUNTER — Telehealth: Payer: Self-pay | Admitting: Lactation Services

## 2023-10-18 NOTE — Telephone Encounter (Signed)
Called patient to assist with Lactation concerns. She did not answer. LM for her to call the office at [phone] to leave a message for Lactation with any concerns. Will call back again tomorrow.

## 2023-10-18 NOTE — Telephone Encounter (Signed)
-----   Message from [facility] sent at 10/18/2023  3:06 PM EST ----- Regarding: Lactation appoitment This mom called back to the unit, a couple weeks postpartum, asking for an O/P lactation visit R/T milk coming in/ progression questions

## 2023-10-19 NOTE — Telephone Encounter (Signed)
Attempted to call patient again about Lactation concerns. She did not answer. LM for her to call the office at [phone] if she is still having concerns in regards to breast feeding or to schedule an OP Lactation appointment.

## 2023-10-23 ENCOUNTER — Telehealth (HOSPITAL_COMMUNITY): Payer: Self-pay | Admitting: *Deleted

## 2023-10-23 NOTE — Telephone Encounter (Signed)
10/23/2023  Name: Anzleigh Amaya MRN: [id number] DOB: 18-Dec-1995  Reason for Call:  Transition of Care Hospital Discharge Call  Contact Status: Patient Contact Status: Message  Language assistant needed:          Follow-Up Questions:    Inocente Salles Postnatal Depression Scale:  In the Past 7 Days:    PHQ2-9 Depression Scale:     Discharge Follow-up:    Post-discharge interventions: NA  Salena Saner, RN 10/23/2023 11:19

## 2023-10-24 ENCOUNTER — Telehealth: Payer: Self-pay | Admitting: Lactation Services

## 2023-10-24 NOTE — Telephone Encounter (Signed)
Attempted to reach patient to see if she would like to schedule an appointment with lactation.

## 2023-10-24 NOTE — Telephone Encounter (Signed)
-----   Message from [facility] sent at 10/18/2023  3:06 PM EST ----- Regarding: Lactation appoitment This mom called back to the unit, a couple weeks postpartum, asking for an O/P lactation visit R/T milk coming in/ progression questions

## 2023-11-28 NOTE — Progress Notes (Signed)
 Patient: Amy Melton  DOB: 14-Sep-1996, age 27 y.o. MRN: [id number]  PCP: Lauraine Patient, PA  Assessment and Plan   1. Current severe episode of major depressive disorder without psychotic features without prior episode (*)  desvenlafaxine  (PRISTIQ ) 50 mg 24 hr tablet   desvenlafaxine  (PRISTIQ ) 100 MG 24 hr tablet    2. GAD (generalized anxiety disorder)  desvenlafaxine  (PRISTIQ ) 50 mg 24 hr tablet   desvenlafaxine  (PRISTIQ ) 100 MG 24 hr tablet    3. Attention deficit hyperactivity disorder (ADHD), combined type  amphetamine-dextroamphetamine (ADDERALL) 20 MG tablet   amphetamine-dextroamphetamine (ADDERALL) 20 MG tablet   amphetamine-dextroamphetamine (ADDERALL) 20 MG tablet    4. Social anxiety disorder      5. Post partum depression  desvenlafaxine  (PRISTIQ ) 50 mg 24 hr tablet   desvenlafaxine  (PRISTIQ ) 100 MG 24 hr tablet    6. Bipolar 2 disorder (*)  desvenlafaxine  (PRISTIQ ) 100 MG 24 hr tablet    7. Intractable migraine without aura and without status migrainosus  rizatriptan (MAXALT) 10 MG tablet    8. Herpes simplex infection of perianal skin  valacyclovir (VALTREX) 1000 mg tablet     Continue current dose of pristiq , restart Adderall, and valtrex.  Refilled maxalt to be used prn migraines.   Follow up in about 3 months (around 02/26/2024) for mental health, ADD. If patient starts to have problems call for an earlier appt  Place of service: patient home  Patient has been advised as to the limitations and limited nature of physical exam due to nature of a VIDEO/TELPHONIC visit, the possibility of privacy risk in the use of a video visit, and that the healthcare provider may recommend visiting a healthcare clinic for in-person care and follow up.  Risks, benefits, and alternatives of the medications and treatment plan prescribed today were discussed, and patient expressed understanding. Plan follow-up as discussed or as needed if any worsening symptoms or change in  condition.  Patient voiced understanding of the treatment plan and agreed to attempt to comply.  See after visit summary for patient specific instructions.    Patient's Medications       * Accurate as of November 28, 2023  3:12 PM. Reflects encounter med changes as of last refresh          New Prescriptions      Instructions  * amphetamine-dextroamphetamine 20 MG tablet Commonly known as: ADDERALL Started by: Lauraine Patient, PA  20 mg, Oral, 3 times a day   * amphetamine-dextroamphetamine 20 MG tablet Commonly known as: ADDERALL Start taking on: December 29, 2023 Started by: Lauraine Patient, PA  20 mg, Oral, 3 times a day   * amphetamine-dextroamphetamine 20 MG tablet Commonly known as: ADDERALL Start taking on: January 29, 2024 Started by: Lauraine Patient, PA  20 mg, Oral, 3 times a day   rizatriptan 10 MG tablet Commonly known as: MAXALT Started by: Lauraine Patient, PA  10 mg, Oral, Once as needed, May repeat in 2 hours if needed      * * This list has 3 medication(s) that are the same as other medications prescribed for you. Read the directions carefully, and ask your doctor or other care provider to review them with you.          Continued Medications      Instructions  valacyclovir 1000 mg tablet Commonly known as: VALTREX  TAKE ONE HALF TABLET BY MOUTH DAILY. INCREASE TO 500MG  TWICE DAILY FOR 3 DAYS IF OUTBREAK OCCURS  Modified Medications      Instructions  * desvenlafaxine  50 mg 24 hr tablet Commonly known as: PRISTIQ  What changed: You were already taking a medication with the same name, and this prescription was added. Make sure you understand how and when to take each. Changed by: Lauraine Patient, PA  50 mg, Oral, Daily, Add to 100mg  for a total of 150mg  daily   * desvenlafaxine  100 MG 24 hr tablet Commonly known as: PRISTIQ  What changed: additional instructions Changed by: Lauraine Patient, PA  100 mg, Oral, Daily, Add to 50mg  for a total daily dose of  150mg       * * This list has 2 medication(s) that are the same as other medications prescribed for you. Read the directions carefully, and ask your doctor or other care provider to review them with you.          Discontinued Medications    albuterol sulfate HFA 108 (90 Base) MCG/ACT inhaler Commonly known as: PROVENTIL,VENTOLIN,PROAIR Stopped by: Lauraine Patient, PA          Subjective   Amy Melton is a 27 y.o. female who presents with:     Patient presents with   mental health   ADHD     Pt presents via Zoom video for recheck mental health, ADHD and medication management  Some post partum depression - birth of daughter 10/19 - was sad and crying and did not feel connected with her daughter - OB increased her pristiq  from 100mg  to 150mg  and that helped a lot - she feels like she is dealing with the adjustment - she is tired but she feels like it is a normal amount  She has stopped breast feeding and is now formula feeding - she restarted Adderall that she had left over and that helped with motivation and anxiety and getting things done around the house  Lives with boyfriend - babies dad - good relationship  Has 14 weeks off work - trying to figure out if worth going back to work with costs of daycare  Out of valtrex and wants to restart that - had no outbreaks during pregnancy or since then  Maxalt was really helpful with her migraines - only had 1 during pregnancy - multiple since delivery and stopping breastfeeding.  History was obtained from patient from home.    Reviewed and updated this visit by provider: Tobacco  Allergies  Meds  Problems  Med Hx  Surg Hx  Fam Hx        Review of Systems  Psychiatric/Behavioral:  Positive for dysphoric mood. The patient is nervous/anxious.         Objective  This visit was done via video through Zoom.  Examination conducted with the use of video cameras/computer monitors. Vital signs and other aspects of physical  exam are limited due to the nature of this encounter.  Physical Exam Constitutional:      Appearance: Normal appearance.  HENT:     Head: Normocephalic and atraumatic.     Right Ear: External ear normal.     Left Ear: External ear normal.  Eyes:     Conjunctiva/sclera: Conjunctivae normal.  Musculoskeletal:     Cervical back: Normal range of motion.  Pulmonary:     Effort: Pulmonary effort is normal.     Comments: Able to talk in full sentences without difficulty. Neurological:     Mental Status: She is alert and oriented to person, place, and time.  Psychiatric:  Mood and Affect: Mood normal.        Behavior: Behavior normal.        Thought Content: Thought content normal.        Judgment: Judgment normal.          Lauraine Allen DEVONNA [facility]  3:12 PM      *Some images could not be shown.

## 2024-05-02 NOTE — Progress Notes (Signed)
 Patient: Amy Melton  DOB: 27-May-1996, age 28 y.o. MRN: [id number]  PCP: Lauraine Patient, PA  Assessment and Plan   1. Current severe episode of major depressive disorder without psychotic features without prior episode (*)      2. GAD (generalized anxiety disorder)      3. Attention deficit hyperactivity disorder (ADHD), combined type       Continue mental health medications - will trial vyvanse 30mg  bid with the use of 60mg  chewables which can be split  Follow up in about 3 months (around 08/02/2024) for ADD, mental health.  Place of service: patient home  Patient has been advised as to the limitations and limited nature of physical exam due to nature of a VIDEO/TELPHONIC visit, the possibility of privacy risk in the use of a video visit, and that the healthcare provider may recommend visiting a healthcare clinic for in-person care and follow up.  Risks, benefits, and alternatives of the medications and treatment plan prescribed today were discussed, and patient expressed understanding. Plan follow-up as discussed or as needed if any worsening symptoms or change in condition.  Patient voiced understanding of the treatment plan and agreed to attempt to comply.  See after visit summary for patient specific instructions.    Patient's Medications       * Accurate as of May 02, 2024 11:59 PM. Reflects encounter med changes as of last refresh          Continued Medications      Instructions  * desvenlafaxine  100 MG 24 hr tablet Commonly known as: PRISTIQ   100 mg, Oral, Daily, Add to 50mg  for a total daily dose of 150mg    * desvenlafaxine  50 mg 24 hr tablet Commonly known as: PRISTIQ   50 mg, Oral, Daily, Add to 100mg  for a total of 150mg  daily   Lisdexamfetamine Dimesylate 60 MG Chew Commonly known as: VYVANSE  60 mg, Oral, Daily   rizatriptan 10 MG tablet Commonly known as: MAXALT  10 mg, Oral, Once as needed, May repeat in 2 hours if needed   valacyclovir  1000 mg tablet Commonly known as: VALTREX  TAKE ONE HALF TABLET BY MOUTH DAILY. INCREASE TO 500MG  TWICE DAILY FOR 3 DAYS IF OUTBREAK OCCURS      * * This list has 2 medication(s) that are the same as other medications prescribed for you. Read the directions carefully, and ask your doctor or other care provider to review them with you.          Discontinued Medications    amphetamine-dextroamphetamine 20 MG tablet Commonly known as: ADDERALL Stopped by: Lauraine Patient, PA          Subjective   Amy Melton is a 28 y.o. female who presents with:     Patient presents with   ADHD     Pt presents via Zoom video for recheck ADHD  Feels like the vyvanse is working well but not lasting all day - helps with energy and focus but only lasts about 6 hours - feels like it helps more than the adderall -   Feels like depression is better - since starting the vyvanse -   Has not tried the 1/2 chewable 2x/day - has to go get the Rx  History was obtained from patient from home.    Reviewed and updated this visit by provider: Tobacco  Allergies  Meds  Problems  Med Hx  Surg Hx  Fam Hx        Review  of Systems  Constitutional:  Negative for appetite change and unexpected weight change.  Cardiovascular:  Negative for chest pain and palpitations.  Psychiatric/Behavioral:  Positive for decreased concentration and dysphoric mood. Negative for sleep disturbance. The patient is not nervous/anxious.         Objective  This visit was done via video through Zoom.  Examination conducted with the use of video cameras/computer monitors. Vital signs and other aspects of physical exam are limited due to the nature of this encounter.  Physical Exam Constitutional:      Appearance: Normal appearance.  HENT:     Head: Normocephalic and atraumatic.     Right Ear: External ear normal.     Left Ear: External ear normal.  Eyes:     Conjunctiva/sclera: Conjunctivae normal.   Musculoskeletal:     Cervical back: Normal range of motion.  Pulmonary:     Effort: Pulmonary effort is normal.     Comments: Able to talk in full sentences without difficulty. Neurological:     Mental Status: She is alert and oriented to person, place, and time.  Psychiatric:        Mood and Affect: Mood normal.        Behavior: Behavior normal.        Thought Content: Thought content normal.        Judgment: Judgment normal.          Lauraine Allen DEVONNA [facility]  7:55 PM      *Some images could not be shown.

## 2024-10-31 NOTE — Progress Notes (Signed)
 Computer technology was used to create visit note. Consent from the patient/caregiver was obtained prior to its use.  [facility] Video Visit   Patient ID:  Amy Melton is a 28 y.o. (DOB 1996/10/31) female Place of service: patient home Patient has been advised as to the limitations and limited nature of physical exam due to nature of a video visit, the possibility of privacy risk in the use of a video visit, and that the healthcare provider may recommend visiting a healthcare clinic for in-person care and follow up.   Video Visit Assessment and Plan   1. Viral syndrome (Primary) 2. [redacted] weeks gestation of pregnancy (*)   Assessment & Plan Viral syndrome: - Symptoms: diarrhea, nausea, wet cough. No fever. - No other significant upper respiratory symptoms. - Tums ineffective for heartburn. Diarrhea persists despite fasting. - Symptoms consistent with viral syndrome. Will proceed with conservative management and supportive care. Options limited by pregnancy. - Consider home COVID-19 test. - Consult OB/GYN regarding Zofran  use. Ginger candies for nausea. - Maintain hydration, gradually reintroduce regular diet. - Inform OB/GYN about condition. - Seek medical attention if symptoms persist beyond 7 days or if fever develops. - Encouraged patient to contact her pediatrician for her daughter's symptoms, if needed.    Outpatient Encounter Medications as of 10/31/2024:    desvenlafaxine  (PRISTIQ ) 100 MG 24 hr tablet, Take one tablet (100 mg dose) by mouth daily.   rizatriptan (MAXALT) 10 MG tablet, Take one tablet (10 mg dose) by mouth once as needed for Migraine for up to 1 dose. May repeat in 2 hours if needed   valacyclovir (VALTREX) 1000 mg tablet, TAKE ONE HALF TABLET BY MOUTH DAILY. INCREASE TO 500MG  TWICE DAILY FOR 3 DAYS IF OUTBREAK OCCURS   Risk, benefits, and alternatives were provided through patient instructions given to the patient electronically and during the video  interaction.  If any worsening symptoms or lack of improvement, the patient will seek immediate medical care.   Video Visit History      Patient presents with   Diarrhea     History of Present Illness The patient is a 28 year old currently at [redacted] weeks gestation.  Diarrhea - Persisting since Monday - Unresponsive to a 12-hour fast  Emesis - Experienced last night - Accompanied by nausea and epigastric burning - Unrelieved by calcium carbonate (Tums) - Followed by urgent defecation within 10-15 minutes  Cough - Productive cough - Associated pharyngalgia and thoracic pain during episodes - Diminished in severity this morning  Additional Information - Denies pyrexia, shortness of breath, sinus congestion, rhinorrhea, and post nasal drip. - Husband exhibited similar gastrointestinal symptoms for 3-4 days but has since recovered - Concern for her 81-year-old daughter, who has also developed a cough and has shown a decreased appetite    Reviewed and updated this visit by provider: Tobacco  Allergies  Meds  Problems  Med Hx  Surg Hx  Fam Hx       ROS:  As documented in the history above, all other relevant system complaints were negative.    Video Visit Objective Findings   Examination conducted with the use of video cameras/computer monitors. Vital signs and other aspects of physical exam are limited due to the nature of this encounter.   Constitutional:  No apparent acute distress noted during the video interaction; Alert and oriented with normal mentation and verbally interactive. Mood:  Appears appropriate to situation.

## 2024-11-22 ENCOUNTER — Other Ambulatory Visit: Payer: Self-pay

## 2024-11-22 ENCOUNTER — Encounter (HOSPITAL_COMMUNITY): Payer: Self-pay | Admitting: Obstetrics and Gynecology

## 2024-11-22 ENCOUNTER — Inpatient Hospital Stay (HOSPITAL_COMMUNITY)
Admission: AD | Admit: 2024-11-22 | Discharge: 2024-11-22 | Disposition: A | Discharge status: Home / Self Care | Attending: Obstetrics & Gynecology | Admitting: Obstetrics & Gynecology

## 2024-11-22 DIAGNOSIS — Z3A26 26 weeks gestation of pregnancy: Secondary | ICD-10-CM

## 2024-11-22 DIAGNOSIS — R030 Elevated blood-pressure reading, without diagnosis of hypertension: Secondary | ICD-10-CM | POA: Diagnosis not present

## 2024-11-22 DIAGNOSIS — O26892 Other specified pregnancy related conditions, second trimester: Secondary | ICD-10-CM

## 2024-11-22 LAB — URINALYSIS, ROUTINE W REFLEX MICROSCOPIC
Bilirubin Urine: NEGATIVE
Glucose, UA: NEGATIVE mg/dL
Hgb urine dipstick: NEGATIVE
Ketones, ur: NEGATIVE mg/dL
Nitrite: NEGATIVE
Protein, ur: NEGATIVE mg/dL
Specific Gravity, Urine: 1.01 (ref 1.005–1.030)
pH: 6 (ref 5.0–8.0)

## 2024-11-22 LAB — URINALYSIS, MICROSCOPIC (REFLEX)

## 2024-11-22 LAB — PROTEIN / CREATININE RATIO, URINE
Creatinine, Urine: 53 mg/dL
Total Protein, Urine: <6 mg/dL

## 2024-11-22 LAB — CBC
HCT: 37.6 % (ref 36.0–46.0)
Hemoglobin: 12.3 g/dL (ref 12.0–15.0)
MCH: 27.8 pg (ref 26.0–34.0)
MCHC: 32.7 g/dL (ref 30.0–36.0)
MCV: 84.9 fL (ref 80.0–100.0)
Platelets: 294 K/uL (ref 150–400)
RBC: 4.43 MIL/uL (ref 3.87–5.11)
RDW: 14.9 % (ref 11.5–15.5)
WBC: 12.5 K/uL — ABNORMAL HIGH (ref 4.0–10.5)
nRBC: 0 % (ref 0.0–0.2)

## 2024-11-22 LAB — COMPREHENSIVE METABOLIC PANEL WITH GFR
ALT: 9 U/L (ref 0–44)
AST: 13 U/L — ABNORMAL LOW (ref 15–41)
Albumin: 2.7 g/dL — ABNORMAL LOW (ref 3.5–5.0)
Alkaline Phosphatase: 62 U/L (ref 38–126)
Anion gap: 11 (ref 5–15)
BUN: <5 mg/dL — ABNORMAL LOW (ref 6–20)
CO2: 19 mmol/L — ABNORMAL LOW (ref 22–32)
Calcium: 8.8 mg/dL — ABNORMAL LOW (ref 8.9–10.3)
Chloride: 106 mmol/L (ref 98–111)
Creatinine, Ser: 0.54 mg/dL (ref 0.44–1.00)
GFR, Estimated: >60 mL/min (ref >60)
Glucose, Bld: 91 mg/dL (ref 70–99)
Potassium: 3.6 mmol/L (ref 3.5–5.1)
Sodium: 136 mmol/L (ref 135–145)
Total Bilirubin: 0.2 mg/dL (ref 0.0–1.2)
Total Protein: 6.6 g/dL (ref 6.5–8.1)

## 2024-11-22 MED ORDER — ACETAMINOPHEN 500 MG PO TABS
1000.0000 mg | ORAL_TABLET | Freq: Once | ORAL | Status: AC
  Administered 2024-11-22: 1000 mg via ORAL
  Filled 2024-11-22: qty 2

## 2024-11-22 NOTE — MAU Note (Signed)
 Amy Melton is a 28 y.o. at [redacted]w[redacted]d here in MAU reporting: has been having a headache and feeling generally off since this morning. States feels similar to when she had GHTN with last baby so went to [facility] and took her BP with the automatic cuff there and got 149/92 so came here to get checked.   Denies LOF, VB, ctx. Reports +FM but states less than yesterday  Pain score: 5/10 headache Vitals:   11/22/24 1510  BP: (!) 144/81  Pulse: 89  Resp: 18  Temp: 98.6 F (37 C)  SpO2: 98%     FHT: 155  Lab orders placed from triage: UA

## 2024-11-22 NOTE — MAU Provider Note (Signed)
 History     CSN: [id number]  Arrival date and time: 11/22/24 1359   None     Chief Complaint  Patient presents with   Hypertension   HPI  [name] is a 28 y.o. female G55P1011 @ [redacted]w[redacted]d here with elevated BP. She went to [facility] to have her BP checked because she did not feel well all day while at work. Her BP at [facility] read 149/92. She currently reports a HA on the left side of her head. She does have a history of migraines and reports this to be similar to a HA she has had in the past. She has not taken anything for her HA today. She currently rates her pain 5/10.   OB History     Gravida  3   Para  1   Term  1   Preterm      AB  1   Living  1      SAB  1   IAB      Ectopic      Multiple  0   Live Births  1           Past Medical History:  Diagnosis Date   Allergy    Anemia    Anxiety     Past Surgical History:  Procedure Laterality Date   CESAREAN SECTION N/A 09/30/2023   Procedure: CESAREAN SECTION;  Surgeon: Diedre Rosaline BRAVO, MD;  Location: [facility] LD ORS;  Service: Obstetrics;  Laterality: N/A;   WISDOM TOOTH EXTRACTION      Family History  Problem Relation Age of Onset   Cancer Mother     Social History[1]  Allergies: Allergies[2]  Medications Prior to Admission  Medication Sig Dispense Refill Last Dose/Taking   aspirin EC 81 MG tablet Take 81 mg by mouth daily. Swallow whole.   11/22/2024   desvenlafaxine  (PRISTIQ ) 100 MG 24 hr tablet Take 100 mg by mouth daily.   11/22/2024   Prenatal Vit-Fe Fumarate-FA (PRENATAL MULTIVITAMIN) TABS tablet Take 1 tablet by mouth daily at 12 noon.   11/22/2024   valACYclovir (VALTREX) 500 MG tablet Take 250 mg by mouth daily.   11/22/2024   acetaminophen  (TYLENOL ) 325 MG tablet Take 2 tablets (650 mg total) by mouth every 6 (six) hours as needed.      cetirizine (ZYRTEC) 10 MG chewable tablet Chew 10 mg by mouth daily.      ibuprofen  (ADVIL ) 600 MG tablet Take 1 tablet (600 mg total) by mouth  every 6 (six) hours. 30 tablet 1    oxyCODONE  (OXY IR/ROXICODONE ) 5 MG immediate release tablet Take 1 tablet (5 mg total) by mouth every 4 (four) hours as needed for moderate pain (pain score 4-6) or severe pain (pain score 7-10). 10 tablet 0    Results for orders placed or performed during the hospital encounter of 11/22/24 (from the past 48 hours)  Urinalysis, Routine w reflex microscopic -Urine, Clean Catch     Status: Abnormal   Collection Time: 11/22/24  3:23 PM  Result Value Ref Range   Color, Urine YELLOW YELLOW   APPearance CLEAR CLEAR   Specific Gravity, Urine 1.010 1.005 - 1.030   pH 6.0 5.0 - 8.0   Glucose, UA NEGATIVE NEGATIVE mg/dL   Hgb urine dipstick NEGATIVE NEGATIVE   Bilirubin Urine NEGATIVE NEGATIVE   Ketones, ur NEGATIVE NEGATIVE mg/dL   Protein, ur NEGATIVE NEGATIVE mg/dL   Nitrite NEGATIVE NEGATIVE   Leukocytes,Ua SMALL (A) NEGATIVE  Comment: Performed at [facility], [address] [place], [place] [address]  Protein / creatinine ratio, urine     Status: None   Collection Time: 11/22/24  3:23 PM  Result Value Ref Range   Creatinine, Urine 53 mg/dL   Total Protein, Urine <6 mg/dL    Comment: NO NORMAL RANGE ESTABLISHED FOR THIS TEST   Protein Creatinine Ratio        0.00 - 0.15 mg/mg[Cre]    Comment: RESULT OUTSIDE REPORTABLE RANGE, UNABLE TO CALCULATE. Performed at [facility], [address], [place], [place] [address]   Urinalysis, Microscopic (reflex)     Status: Abnormal   Collection Time: 11/22/24  3:23 PM  Result Value Ref Range   RBC / HPF 0-5 0 - 5 RBC/hpf   WBC, UA 0-5 0 - 5 WBC/hpf   Bacteria, UA RARE (A) NONE SEEN   Squamous Epithelial / HPF 0-5 0 - 5 /HPF   Mucus PRESENT     Comment: Performed at [facility], [address] [place], [place] [address]  CBC     Status: Abnormal   Collection Time: 11/22/24  4:22 PM  Result Value Ref Range   WBC 12.5 (H) 4.0 - 10.5 K/uL   RBC 4.43 3.87 - 5.11 MIL/uL   Hemoglobin 12.3  12.0 - 15.0 g/dL   HCT 62.3 63.9 - 53.9 %   MCV 84.9 80.0 - 100.0 fL   MCH 27.8 26.0 - 34.0 pg   MCHC 32.7 30.0 - 36.0 g/dL   RDW 85.0 88.4 - 84.4 %   Platelets 294 150 - 400 K/uL   nRBC 0.0 0.0 - 0.2 %    Comment: Performed at [facility], [address], [place], [place] [address]  Comprehensive metabolic panel with GFR     Status: Abnormal   Collection Time: 11/22/24  4:22 PM  Result Value Ref Range   Sodium 136 135 - 145 mmol/L   Potassium 3.6 3.5 - 5.1 mmol/L   Chloride 106 98 - 111 mmol/L   CO2 19 (L) 22 - 32 mmol/L   Glucose, Bld 91 70 - 99 mg/dL    Comment: Glucose reference range applies only to samples taken after fasting for at least 8 hours.   BUN <5 (L) 6 - 20 mg/dL   Creatinine, Ser 9.45 0.44 - 1.00 mg/dL   Calcium 8.8 (L) 8.9 - 10.3 mg/dL   Total Protein 6.6 6.5 - 8.1 g/dL   Albumin 2.7 (L) 3.5 - 5.0 g/dL   AST 13 (L) 15 - 41 U/L   ALT 9 0 - 44 U/L   Alkaline Phosphatase 62 38 - 126 U/L   Total Bilirubin 0.2 0.0 - 1.2 mg/dL   GFR, Estimated >39 >39 mL/min    Comment: (NOTE) Calculated using the CKD-EPI Creatinine Equation (2021)    Anion gap 11 5 - 15    Comment: Performed at [facility], [address] [place], [place] [address]    Review of Systems  Eyes:  Negative for photophobia and visual disturbance.  Gastrointestinal:  Negative for abdominal pain.  Genitourinary:  Negative for vaginal bleeding.  Neurological:  Positive for headaches.   Physical Exam   Blood pressure 138/77, pulse 84, temperature 98.6 F (37 C), temperature source Oral, resp. rate 18, height 5' 5 (1.651 m), weight 115.5 kg, SpO2 98%, unknown if currently breastfeeding.  Patient Vitals for the past 24 hrs:  BP Temp  Temp src Pulse Resp SpO2 Height Weight  11/22/24 1630 135/77 -- -- 83 -- -- -- --  11/22/24 1615 138/77 -- -- 84 -- -- -- --  11/22/24 1600 128/80 -- -- 84 -- -- -- --  11/22/24 1545 130/74 -- -- 84 -- -- -- --  11/22/24 1530 136/72 -- -- 79 -- -- -- --   11/22/24 1515 (!) 140/70 -- -- 88 -- -- -- --  11/22/24 1510 (!) 144/81 98.6 F (37 C) Oral 89 18 98 % 5' 5 (1.651 m) 115.5 kg    Physical Exam Constitutional:      General: She is not in acute distress.    Appearance: Normal appearance. She is not ill-appearing, toxic-appearing or diaphoretic.  HENT:     Head: Normocephalic.  Eyes:     Pupils: Pupils are equal, round, and reactive to light.  Musculoskeletal:     Right lower leg: No edema.     Left lower leg: No edema.  Neurological:     Mental Status: She is alert and oriented to person, place, and time.     Deep Tendon Reflexes: Reflexes normal.   Fetal Tracing: Baseline: 130 bpm Variability: Moderate  Accelerations: 15x15 Decelerations: None Toco: None  MAU Course  Procedures  MDM  PIH labs collected and reassuring  BP's now normal.  Tylenol  given 1000 mg and headache is now 0/10 Spoke to Dr. Lane to arrange for follow up   Assessment and Plan   A:  1. Elevated BP without diagnosis of hypertension   2. [redacted] weeks gestation of pregnancy      P:  Dc home Follow up with OB Monday for BP check. Dr. Lane to arrange Strict return precautions. Preeclampsia precautions.  Dorita Delon FERNS, NP 11/22/2024 6:14 PM      [1]  Social History Tobacco Use   Smoking status: Former   Smokeless tobacco: Never  Vaping Use   Vaping status: Never Used  Substance Use Topics   Alcohol use: Not Currently    Alcohol/week: 7.0 standard drinks of alcohol    Types: 1 Glasses of wine, 5 Cans of beer, 1 Shots of liquor per week   Drug use: No  [2] No Known Allergies

## 2024-12-16 NOTE — Progress Notes (Unsigned)
 Class start Time: ***   Class End Time: ***  This was a class of *** patients.   Patient was seen on *** for Gestational Diabetes self-management class at the [facility]. The following learning objectives were met by the patient during this course:  States the definition of Gestational Diabetes States why dietary management is important in controlling blood glucose Describes the effects each nutrient has on blood glucose levels Demonstrates ability to create a balanced meal plan Demonstrates carbohydrate counting  States when to check blood glucose levels Demonstrates proper blood glucose monitoring techniques States the effect of stress and exercise on blood glucose levels States the importance of limiting caffeine and abstaining from alcohol and smoking  Blood glucose monitor given: *** Lot # *** Exp: *** Blood glucose reading: ***  *** Patient has a meter prior to visit. Patient is *** testing pre breakfast and 2 hours after each meal. FBS: *** Postprandial: *** Blood glucose today in class ***  Patient instructed to monitor glucose levels:  QID FBS: 60 - <95 1 hour: <140 2 hour: <120  *Patient received handouts: Nutrition Diabetes and Pregnancy Carbohydrate Counting List Blood glucose log Snack ideas for diabetes during pregnancy Plate Planner  Patient will be seen for follow-up as needed.

## 2024-12-18 ENCOUNTER — Encounter: Attending: Obstetrics and Gynecology | Admitting: Dietician

## 2024-12-18 DIAGNOSIS — O24419 Gestational diabetes mellitus in pregnancy, unspecified control: Secondary | ICD-10-CM | POA: Insufficient documentation

## 2025-01-15 ENCOUNTER — Inpatient Hospital Stay (HOSPITAL_COMMUNITY): Admission: AD | Admit: 2025-01-15 | Discharge: 2025-01-15 | Disposition: A | Discharge status: Home / Self Care

## 2025-01-15 ENCOUNTER — Encounter (HOSPITAL_COMMUNITY): Payer: Self-pay

## 2025-01-15 DIAGNOSIS — R519 Headache, unspecified: Secondary | ICD-10-CM | POA: Insufficient documentation

## 2025-01-15 DIAGNOSIS — O26893 Other specified pregnancy related conditions, third trimester: Secondary | ICD-10-CM | POA: Insufficient documentation

## 2025-01-15 DIAGNOSIS — Z3689 Encounter for other specified antenatal screening: Secondary | ICD-10-CM | POA: Insufficient documentation

## 2025-01-15 DIAGNOSIS — Z3A34 34 weeks gestation of pregnancy: Secondary | ICD-10-CM | POA: Insufficient documentation

## 2025-01-15 DIAGNOSIS — R030 Elevated blood-pressure reading, without diagnosis of hypertension: Secondary | ICD-10-CM | POA: Insufficient documentation

## 2025-01-15 LAB — COMPREHENSIVE METABOLIC PANEL WITH GFR
ALT: 14 U/L (ref 0–44)
AST: 17 U/L (ref 15–41)
Albumin: 3.5 g/dL (ref 3.5–5.0)
Alkaline Phosphatase: 112 U/L (ref 38–126)
Anion gap: 13 (ref 5–15)
BUN: 12 mg/dL (ref 6–20)
CO2: 21 mmol/L — ABNORMAL LOW (ref 22–32)
Calcium: 9.6 mg/dL (ref 8.9–10.3)
Chloride: 101 mmol/L (ref 98–111)
Creatinine, Ser: 0.53 mg/dL (ref 0.44–1.00)
GFR, Estimated: >60 mL/min
Glucose, Bld: 90 mg/dL (ref 70–99)
Potassium: 3.9 mmol/L (ref 3.5–5.1)
Sodium: 135 mmol/L (ref 135–145)
Total Bilirubin: 0.2 mg/dL (ref 0.0–1.2)
Total Protein: 7.1 g/dL (ref 6.5–8.1)

## 2025-01-15 LAB — URINALYSIS, ROUTINE W REFLEX MICROSCOPIC
Bilirubin Urine: NEGATIVE
Glucose, UA: NEGATIVE mg/dL
Hgb urine dipstick: NEGATIVE
Ketones, ur: NEGATIVE mg/dL
Leukocytes,Ua: NEGATIVE
Nitrite: NEGATIVE
Protein, ur: NEGATIVE mg/dL
Specific Gravity, Urine: 1.006 (ref 1.005–1.030)
pH: 6 (ref 5.0–8.0)

## 2025-01-15 LAB — CBC
HCT: 37.8 % (ref 36.0–46.0)
Hemoglobin: 12.1 g/dL (ref 12.0–15.0)
MCH: 26.8 pg (ref 26.0–34.0)
MCHC: 32 g/dL (ref 30.0–36.0)
MCV: 83.8 fL (ref 80.0–100.0)
Platelets: 290 10*3/uL (ref 150–400)
RBC: 4.51 MIL/uL (ref 3.87–5.11)
RDW: 15.5 % (ref 11.5–15.5)
WBC: 9.2 10*3/uL (ref 4.0–10.5)
nRBC: 0 % (ref 0.0–0.2)

## 2025-01-15 LAB — PROTEIN / CREATININE RATIO, URINE
Creatinine, Urine: 35 mg/dL
Total Protein, Urine: <6 mg/dL

## 2025-01-15 MED ORDER — CYCLOBENZAPRINE HCL 10 MG PO TABS
10.0000 mg | ORAL_TABLET | Freq: Once | ORAL | Status: AC
  Administered 2025-01-15: 10 mg via ORAL
  Filled 2025-01-15: qty 1

## 2025-01-15 MED ORDER — ACETAMINOPHEN-CAFFEINE 500-65 MG PO TABS: 2.0000 | ORAL_TABLET | Freq: Four times a day (QID) | ORAL | 0 refills | Status: AC | PRN

## 2025-01-15 MED ORDER — ACETAMINOPHEN-CAFFEINE 500-65 MG PO TABS
2.0000 | ORAL_TABLET | Freq: Once | ORAL | Status: AC
  Administered 2025-01-15: 2 via ORAL
  Filled 2025-01-15: qty 2

## 2025-01-15 NOTE — MAU Provider Note (Signed)
 Chief Complaint:  Headache and Hypertension   HPI    Amy Melton is a 29 y.o. G3P1011 at [redacted]w[redacted]d who presents to maternity admissions reporting that she was seen at her eye doctor's appointment this morning and her blood pressure was 150/94.  Patient reports she checked it at home with her machine and it was 157/100.  She also reports she has had a headache today that has not responded to Tylenol  which was last taken at 1230.  She denies right upper quadrant pain, visual changes, shortness of breath, chest pain, or increased lower extremity edema..   Pregnancy Course: [facility]  Available prenatal records reviewed.  Patient denies any history of chronic hypertension but upon review of records had elevated blood pressures on 11/22/2024 when seen here in the MAU mild range 140s over 70s to 80s.  Past Medical History:  Diagnosis Date   Allergy    Anemia    Anxiety    OB History  Gravida Para Term Preterm AB Living  3 1 1  1 1   SAB IAB Ectopic Multiple Live Births  1   0 1    # Outcome Date GA Lbr Len/2nd Weight Sex Type Anes PTL Lv  3 Current           2 SAB 04/2024     SAB     1 Term 09/30/23 [redacted]w[redacted]d  3410 g F CS-LTranv Spinal  LIV   Past Surgical History:  Procedure Laterality Date   CESAREAN SECTION N/A 09/30/2023   Procedure: CESAREAN SECTION;  Surgeon: Diedre Rosaline BRAVO, MD;  Location: [facility]S;  Service: Obstetrics;  Laterality: N/A;   WISDOM TOOTH EXTRACTION     Family History  Problem Relation Age of Onset   Cancer Mother    Social History[1] Allergies[2] No medications prior to admission.    I have reviewed patient's Past Medical Hx, Surgical Hx, Family Hx, Social Hx, medications and allergies.   ROS  Pertinent items noted in HPI and remainder of comprehensive ROS otherwise negative.   PHYSICAL EXAM  Patient Vitals for the past 24 hrs:  BP Temp Temp src Pulse Resp SpO2 Height Weight  01/15/25 1745 119/71 -- -- 95 -- -- -- --  01/15/25 1733 111/72 --  -- 90 -- -- -- --  01/15/25 1715 117/72 -- -- 92 -- -- -- --  01/15/25 1702 119/63 -- -- 90 -- -- -- --  01/15/25 1645 120/72 -- -- 86 -- -- -- --  01/15/25 1630 115/73 -- -- 90 -- 98 % -- --  01/15/25 1625 125/76 97.6 F (36.4 C) Oral (!) 103 18 99 % -- --  01/15/25 1620 -- -- -- -- -- -- 5' 5 (1.651 m) 116.1 kg    Constitutional: Well-developed, obese female in no acute distress.  Cardiovascular: normal rate & rhythm, warm and well-perfused Respiratory: normal effort, no problems with respiration noted GI: Abd soft, non-tender, gravid, no ruq pain illicited on light palpation MS: Extremities nontender, 2+ lower extremity edema, normal ROM, normal reflexes B/L Neurologic: Alert and oriented x 4.  Pelvic: Deferred     Fetal Tracing: Baseline: 135-140 Variability: moderate  Accelerations: present Decelerations: absent Toco: UI   Labs: Results for orders placed or performed during the hospital encounter of 01/15/25 (from the past 24 hours)  Protein / creatinine ratio, urine     Status: None   Collection Time: 01/15/25  4:34 PM  Result Value Ref Range   Creatinine, Urine 35 mg/dL  Total Protein, Urine <6 mg/dL   Protein Creatinine Ratio NOT CALCULATED <0.2 mg/mg  Urinalysis, Routine w reflex microscopic -Urine, Clean Catch     Status: Abnormal   Collection Time: 01/15/25  4:34 PM  Result Value Ref Range   Color, Urine STRAW (A) YELLOW   APPearance CLEAR CLEAR   Specific Gravity, Urine 1.006 1.005 - 1.030   pH 6.0 5.0 - 8.0   Glucose, UA NEGATIVE NEGATIVE mg/dL   Hgb urine dipstick NEGATIVE NEGATIVE   Bilirubin Urine NEGATIVE NEGATIVE   Ketones, ur NEGATIVE NEGATIVE mg/dL   Protein, ur NEGATIVE NEGATIVE mg/dL   Nitrite NEGATIVE NEGATIVE   Leukocytes,Ua NEGATIVE NEGATIVE  Comprehensive metabolic panel with GFR     Status: Abnormal   Collection Time: 01/15/25  4:34 PM  Result Value Ref Range   Sodium 135 135 - 145 mmol/L   Potassium 3.9 3.5 - 5.1 mmol/L   Chloride 101  98 - 111 mmol/L   CO2 21 (L) 22 - 32 mmol/L   Glucose, Bld 90 70 - 99 mg/dL   BUN 12 6 - 20 mg/dL   Creatinine, Ser 9.46 0.44 - 1.00 mg/dL   Calcium 9.6 8.9 - 89.6 mg/dL   Total Protein 7.1 6.5 - 8.1 g/dL   Albumin 3.5 3.5 - 5.0 g/dL   AST 17 15 - 41 U/L   ALT 14 0 - 44 U/L   Alkaline Phosphatase 112 38 - 126 U/L   Total Bilirubin 0.2 0.0 - 1.2 mg/dL   GFR, Estimated >39 >39 mL/min   Anion gap 13 5 - 15  CBC     Status: None   Collection Time: 01/15/25  4:34 PM  Result Value Ref Range   WBC 9.2 4.0 - 10.5 K/uL   RBC 4.51 3.87 - 5.11 MIL/uL   Hemoglobin 12.1 12.0 - 15.0 g/dL   HCT 62.1 63.9 - 53.9 %   MCV 83.8 80.0 - 100.0 fL   MCH 26.8 26.0 - 34.0 pg   MCHC 32.0 30.0 - 36.0 g/dL   RDW 84.4 88.4 - 84.4 %   Platelets 290 150 - 400 K/uL   nRBC 0.0 0.0 - 0.2 %    Imaging:  No results found.  MDM & MAU COURSE  MDM:   HIGH  Available prenatal records reviewed Physical exam performed  CBC: NM for pregnancy CMP: NM P/C Ratio: Unable to calculate (Low) BP Monitoring:  Normotensive readings while in MAU Excedrin/Flexeril  given for headache: Resolve noted   Differential Diagnosis of Hypertension in pregnancy can be preexisting chronic hypertension, Gestational hypertension, preeclampsia, preeclampsia with Severe features, Chronic hypertension with superimposed Preeclampsia, HELLP Syndrome, all of these conditions can lead to Eclampsia, pulmonary edema, IUGR, and/or placenta abruption. Precise diagnosis is often challenging. High clinical suspicion is warranted given the increase in maternal and fetal-neonatal risks associated with preeclampsia.   MAU Course: Orders Placed This Encounter  Procedures   Protein / creatinine ratio, urine   Urinalysis, Routine w reflex microscopic -Urine, Clean Catch   Comprehensive metabolic panel with GFR   CBC   Measure blood pressure   Discharge patient Discharge disposition: 01-Home or Self Care; Discharge patient date: 01/15/2025    Meds ordered this encounter  Medications   acetaminophen -caffeine  (EXCEDRIN TENSION HEADACHE) 500-65 MG per tablet 2 tablet   cyclobenzaprine  (FLEXERIL ) tablet 10 mg   acetaminophen -caffeine  (EXCEDRIN TENSION HEADACHE) 500-65 MG TABS per tablet    Sig: Take 2 tablets by mouth 4 (four) times daily as needed (Foe  headaches).    Dispense:  60 tablet    Refill:  0    Supervising Provider:   PRATT, TANYA S [[id number]]    I have reviewed the patient chart and performed the physical exam . I have ordered & interpreted the lab results and reviewed and interpreted the NST    ASSESSMENT   1. Nonintractable headache, unspecified chronicity pattern, unspecified headache type   2. Elevated blood pressure reading without diagnosis of hypertension   3. [redacted] weeks gestation of pregnancy   4. NST (non-stress test) reactive on fetal surveillance     PLAN  Discharge home in stable condition with return precautions.   Follow-up in primary OB office on Friday for BP check  Signs and symptoms of preeclampsia reviewed with patient and literature provided in AVS  See AVS for full description of information given to the patient including both verbal and written. Patient verbalized understanding and agrees with the plan as described above.     Follow-up Information     [facility] Follow up on 01/17/2025.   Why: If symptoms worsen or fail to resolve, As scheduled for ongoing prenatal care, Blood pressure check Contact information: [address]  [address] [place] [address] [phone]                 Allergies as of 01/15/2025   No Known Allergies      Medication List     TAKE these medications    acetaminophen  325 MG tablet Commonly known as: TYLENOL  Take 2 tablets (650 mg total) by mouth every 6 (six) hours as needed.   acetaminophen -caffeine  500-65 MG Tabs per tablet Commonly known as: EXCEDRIN TENSION HEADACHE Take 2 tablets by mouth 4 (four) times daily as  needed (Foe headaches).   aspirin EC 81 MG tablet Take 81 mg by mouth daily. Swallow whole.   cetirizine 10 MG chewable tablet Commonly known as: ZYRTEC Chew 10 mg by mouth daily.   ibuprofen  600 MG tablet Commonly known as: ADVIL  Take 1 tablet (600 mg total) by mouth every 6 (six) hours.   oxyCODONE  5 MG immediate release tablet Commonly known as: Oxy IR/ROXICODONE  Take 1 tablet (5 mg total) by mouth every 4 (four) hours as needed for moderate pain (pain score 4-6) or severe pain (pain score 7-10).   prenatal multivitamin Tabs tablet Take 1 tablet by mouth daily at 12 noon.   valACYclovir 500 MG tablet Commonly known as: VALTREX Take 250 mg by mouth daily.        Olam Dalton, MSN, WHNP-BC [facility] [employer]       [1]  Social History Tobacco Use   Smoking status: Former   Smokeless tobacco: Never  Vaping Use   Vaping status: Never Used  Substance Use Topics   Alcohol use: Not Currently    Alcohol/week: 7.0 standard drinks of alcohol    Types: 1 Glasses of wine, 5 Cans of beer, 1 Shots of liquor per week   Drug use: No  [2] No Known Allergies

## 2025-01-15 NOTE — Discharge Instructions (Signed)

## 2025-01-15 NOTE — MAU Note (Signed)
 MAU Triage Note: Amy Melton is a 29 y.o. at [redacted]w[redacted]d here in MAU reporting: BP was checked at her eye doctor appointment and was 150/94 with the wrist machine. She checked it at home with her BP machine and was 157/100. Denies VB or watery LOF. Reports +FM.  PIH Assessment: Headache present: Yes ;Has not responded to treatment and ;Last took Tylenol  at 1230 Visual disturbances: None RUQ pain/Epigastric: None Atypical edema: None Hx of HBP: h/o PIH with prior pregnancy BP Medications: None prescribed   Patient complaint: HBP, 150 over 100, HA, Nausea  Pain Score: 4  Pain Location: Head     Onset of complaint: today LMP: No LMP recorded. Patient is pregnant.  Vitals:   01/15/25 1625  BP: 125/76  Pulse: (!) 103  Resp: 18  Temp: 97.6 F (36.4 C)  SpO2: 99%    FHT:  Fetal Heart Rate Mode: External Baseline Rate (A): 150 bpm Lab orders placed from triage: P/C ratio, CMP, CBC
